# Patient Record
Sex: Male | Born: 1985 | Race: White | Hispanic: No | Marital: Single | Smoking: Current every day smoker
Health system: Southern US, Community
[De-identification: ages and names within clinical notes are randomized; demographics above are authoritative.]

## PROBLEM LIST (undated history)

## (undated) DIAGNOSIS — F1990 Other psychoactive substance use, unspecified, uncomplicated: Secondary | ICD-10-CM

## (undated) HISTORY — PX: APPENDECTOMY: SHX54

## (undated) HISTORY — DX: Other psychoactive substance use, unspecified, uncomplicated: F19.90

---
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
## 2012-01-08 ENCOUNTER — Ambulatory Visit (INDEPENDENT_AMBULATORY_CARE_PROVIDER_SITE_OTHER): Payer: BC Managed Care – PPO | Admitting: Family Medicine

## 2012-01-08 VITALS — BP 111/69 | HR 75 | Temp 99.0°F | Resp 16 | Ht 67.0 in | Wt 138.6 lb

## 2012-01-08 DIAGNOSIS — J029 Acute pharyngitis, unspecified: Secondary | ICD-10-CM

## 2012-01-08 DIAGNOSIS — J020 Streptococcal pharyngitis: Secondary | ICD-10-CM

## 2012-01-08 LAB — POCT RAPID STREP A (OFFICE): Rapid Strep A Screen: POSITIVE — AB

## 2012-01-08 MED ORDER — AMOXICILLIN 875 MG PO TABS: 875.0000 mg | ORAL_TABLET | Freq: Two times a day (BID) | ORAL | Status: DC

## 2012-01-08 NOTE — Patient Instructions (Addendum)
Strep Infections  Streptococcal (strep) infections are caused by streptococcal germs (bacteria). Strep infections are very contagious. Strep infections can occur in:   Ears.   The nose.   The throat.   Sinuses.   Skin.   Blood.   Lungs.   Spinal fluid.   Urine.  Strep throat is the most common bacterial infection in children. The symptoms of a Strep infection usually get better in 2 to 3 days after starting medicine that kills germs (antibiotics). Strep is usually not contagious after 36 to 48 hours of antibiotic treatment. Strep infections that are not treated can cause serious complications. These include gland infections, throat abscess, rheumatic fever and kidney disease.  DIAGNOSIS   The diagnosis of strep is made by:   A culture for the strep germ.  TREATMENT   These infections require oral antibiotics for a full 10 days, an antibiotic shot or antibiotics given into the vein (intravenous, IV).  HOME CARE INSTRUCTIONS    Be sure to finish all antibiotics even if feeling better.   Only take over-the-counter medicines for pain, discomfort and or fever, as directed by your caregiver.   Close contacts that have a fever, sore throat or illness symptoms should see their caregiver right away.   You or your child may return to work, school or daycare if the fever and pain are better in 2 to 3 days after starting antibiotics.  SEEK MEDICAL CARE IF:    You or your child has an oral temperature above 102 F (38.9 C).   Your baby is older than 3 months with a rectal temperature of 100.5 F (38.1 C) or higher for more than 1 day.   You or your child is not better in 3 days.  SEEK IMMEDIATE MEDICAL CARE IF:    You or your child has an oral temperature above 102 F (38.9 C), not controlled by medicine.   Your baby is older than 3 months with a rectal temperature of 102 F (38.9 C) or higher.   Your baby is 3 months old or younger with a rectal temperature of 100.4 F (38 C) or higher.   There is a  spreading rash.   There is difficulty swallowing or breathing.   There is increased pain or swelling.  Document Released: 03/01/2004 Document Revised: 04/16/2011 Document Reviewed: 12/08/2008  ExitCare Patient Information 2013 [employer].

## 2012-01-08 NOTE — Progress Notes (Signed)
26 yo man who developed sore throat on Sunday, followed by nausea and vomiting yesterday.  Now having chills, weakness, sore throat. Nausea has resolved.   No longer vomiting.  No diarrhea.  No abdominal pain.  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 Results for orders placed in visit on 01/08/12  POCT RAPID STREP A (OFFICE)      Component Value Range   Rapid Strep A Screen Positive (*) Negative    Assessment:  Strep throat  Plan:

## 2013-03-10 ENCOUNTER — Ambulatory Visit (INDEPENDENT_AMBULATORY_CARE_PROVIDER_SITE_OTHER): Payer: BC Managed Care – PPO | Admitting: Family Medicine

## 2013-03-10 VITALS — BP 110/70 | HR 62 | Temp 97.7°F | Resp 16 | Ht 67.0 in | Wt 136.0 lb

## 2013-03-10 DIAGNOSIS — B029 Zoster without complications: Secondary | ICD-10-CM

## 2013-03-10 DIAGNOSIS — G4700 Insomnia, unspecified: Secondary | ICD-10-CM

## 2013-03-10 MED ORDER — TRAZODONE HCL 100 MG PO TABS: 100.0000 mg | ORAL_TABLET | Freq: Every day | ORAL | Status: DC

## 2013-03-10 MED ORDER — VALACYCLOVIR HCL 1 G PO TABS: 1000.0000 mg | ORAL_TABLET | Freq: Three times a day (TID) | ORAL | Status: DC

## 2013-03-10 NOTE — Patient Instructions (Signed)
Insomnia Insomnia is frequent trouble falling and/or staying asleep. Insomnia can be a long term problem or a short term problem. Both are common. Insomnia can be a short term problem when the wakefulness is related to a certain stress or worry. Long term insomnia is often related to ongoing stress during waking hours and/or poor sleeping habits. Overtime, sleep deprivation itself can make the problem worse. Every little thing feels more severe because you are overtired and your ability to cope is decreased. CAUSES   Stress, anxiety, and depression.  Poor sleeping habits.  Distractions such as TV in the bedroom.  Naps close to bedtime.  Engaging in emotionally charged conversations before bed.  Technical reading before sleep.  Alcohol and other sedatives. They may make the problem worse. They can hurt normal sleep patterns and normal dream activity.  Stimulants such as caffeine for several hours prior to bedtime.  Pain syndromes and shortness of breath can cause insomnia.  Exercise late at night.  Changing time zones may cause sleeping problems (jet lag). It is sometimes helpful to have someone observe your sleeping patterns. They should look for periods of not breathing during the night (sleep apnea). They should also look to see how long those periods last. If you live alone or observers are uncertain, you can also be observed at a sleep clinic where your sleep patterns will be professionally monitored. Sleep apnea requires a checkup and treatment. Give your caregivers your medical history. Give your caregivers observations your family has made about your sleep.  SYMPTOMS   Not feeling rested in the morning.  Anxiety and restlessness at bedtime.  Difficulty falling and staying asleep. TREATMENT   Your caregiver may prescribe treatment for an underlying medical disorders. Your caregiver can give advice or help if you are using alcohol or other drugs for self-medication. Treatment  of underlying problems will usually eliminate insomnia problems.  Medications can be prescribed for short time use. They are generally not recommended for lengthy use.  Over-the-counter sleep medicines are not recommended for lengthy use. They can be habit forming.  You can promote easier sleeping by making lifestyle changes such as:  Using relaxation techniques that help with breathing and reduce muscle tension.  Exercising earlier in the day.  Changing your diet and the time of your last meal. No night time snacks.  Establish a regular time to go to bed.  Counseling can help with stressful problems and worry.  Soothing music and white noise may be helpful if there are background noises you cannot remove.  Stop tedious detailed work at least one hour before bedtime. HOME CARE INSTRUCTIONS   Keep a diary. Inform your caregiver about your progress. This includes any medication side effects. See your caregiver regularly. Take note of:  Times when you are asleep.  Times when you are awake during the night.  The quality of your sleep.  How you feel the next day. This information will help your caregiver care for you.  Get out of bed if you are still awake after 15 minutes. Read or do some quiet activity. Keep the lights down. Wait until you feel sleepy and go back to bed.  Keep regular sleeping and waking hours. Avoid naps.  Exercise regularly.  Avoid distractions at bedtime. Distractions include watching television or engaging in any intense or detailed activity like attempting to balance the household checkbook.  Develop a bedtime ritual. Keep a familiar routine of bathing, brushing your teeth, climbing into bed at the same   time each night, listening to soothing music. Routines increase the success of falling to sleep faster.  Use relaxation techniques. This can be using breathing and muscle tension release routines. It can also include visualizing peaceful scenes. You can  also help control troubling or intruding thoughts by keeping your mind occupied with boring or repetitive thoughts like the old concept of counting sheep. You can make it more creative like imagining planting one beautiful flower after another in your backyard garden.  During your day, work to eliminate stress. When this is not possible use some of the previous suggestions to help reduce the anxiety that accompanies stressful situations. MAKE SURE YOU:   Understand these instructions.  Will watch your condition.  Will get help right away if you are not doing well or get worse. Document Released: 01/20/2000 Document Revised: 04/16/2011 Document Reviewed: 02/19/2007 [facility] Patient Information 2014 [place], [place]. Shingles Shingles (herpes zoster) is an infection that is caused by the same virus that causes chickenpox (varicella). The infection causes a painful skin rash and fluid-filled blisters, which eventually break open, crust over, and heal. It may occur in any area of the body, but it usually affects only one side of the body or face. The pain of shingles usually lasts about 1 month. However, some people with shingles may develop long-term (chronic) pain in the affected area of the body. Shingles often occurs many years after the person had chickenpox. It is more common:  In people older than 50 years.  In people with weakened immune systems, such as those with HIV, AIDS, or cancer.  In people taking medicines that weaken the immune system, such as transplant medicines.  In people under great stress. CAUSES  Shingles is caused by the varicella zoster virus (VZV), which also causes chickenpox. After a person is infected with the virus, it can remain in the person's body for years in an inactive state (dormant). To cause shingles, the virus reactivates and breaks out as an infection in a nerve root. The virus can be spread from person to person (contagious) through contact with open  blisters of the shingles rash. It will only spread to people who have not had chickenpox. When these people are exposed to the virus, they may develop chickenpox. They will not develop shingles. Once the blisters scab over, the person is no longer contagious and cannot spread the virus to others. SYMPTOMS  Shingles shows up in stages. The initial symptoms may be pain, itching, and tingling in an area of the skin. This pain is usually described as burning, stabbing, or throbbing.In a few days or weeks, a painful red rash will appear in the area where the pain, itching, and tingling were felt. The rash is usually on one side of the body in a band or belt-like pattern. Then, the rash usually turns into fluid-filled blisters. They will scab over and dry up in approximately 2 3 weeks. Flu-like symptoms may also occur with the initial symptoms, the rash, or the blisters. These may include:  Fever.  Chills.  Headache.  Upset stomach. DIAGNOSIS  Your caregiver will perform a skin exam to diagnose shingles. Skin scrapings or fluid samples may also be taken from the blisters. This sample will be examined under a microscope or sent to a lab for further testing. TREATMENT  There is no specific cure for shingles. Your caregiver will likely prescribe medicines to help you manage the pain, recover faster, and avoid long-term problems. This may include antiviral drugs, anti-inflammatory  drugs, and pain medicines. HOME CARE INSTRUCTIONS   Take a cool bath or apply cool compresses to the area of the rash or blisters as directed. This may help with the pain and itching.   Only take over-the-counter or prescription medicines as directed by your caregiver.   Rest as directed by your caregiver.  Keep your rash and blisters clean with mild soap and cool water or as directed by your caregiver.  Do not pick your blisters or scratch your rash. Apply an anti-itch cream or numbing creams to the affected area as  directed by your caregiver.  Keep your shingles rash covered with a loose bandage (dressing).  Avoid skin contact with:  Babies.   Pregnant women.   Children with eczema.   Elderly people with transplants.   People with chronic illnesses, such as leukemia or AIDS.   Wear loose-fitting clothing to help ease the pain of material rubbing against the rash.  Keep all follow-up appointments with your caregiver.If the area involved is on your face, you may receive a referral for follow-up to a specialist, such as an eye doctor (ophthalmologist) or an ear, nose, and throat (ENT) doctor. Keeping all follow-up appointments will help you avoid eye complications, chronic pain, or disability.  SEEK IMMEDIATE MEDICAL CARE IF:   You have facial pain, pain around the eye area, or loss of feeling on one side of your face.  You have ear pain or ringing in your ear.  You have loss of taste.  Your pain is not relieved with prescribed medicines.   Your redness or swelling spreads.   You have more pain and swelling.  Your condition is worsening or has changed.   You have a feveror persistent symptoms for more than 2 3 days.  You have a fever and your symptoms suddenly get worse. MAKE SURE YOU:  Understand these instructions.  Will watch your condition.  Will get help right away if you are not doing well or get worse. Document Released: 01/22/2005 Document Revised: 10/17/2011 Document Reviewed: 09/06/2011 [facility] Patient Information 2014 [place], [place].

## 2013-03-10 NOTE — Progress Notes (Signed)
° °  Subjective:  This chart was scribed for Elvina SidleKurt Lauenstein, MD by Carl Bestelina Holson, Medical Scribe. This patient was seen in Room 2 and the patient's care was started at 4:29 PM.   Patient ID: Jermaine Hood, male    DOB: 06-25-1985, 28 y.o.   MRN: [id number]  HPI HPI Comments: Jermaine Hood is a 28 y.o. male who presents to the [facility] complaining of a constant burning, and tingling rash located on the left side of his scalp that started three days ago.  He states that the burning sensation is present on the left side of his face.  He lists sleep disturbances, headaches, and fatigue as associated symptoms.  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    History reviewed. No pertinent past medical history. Past Surgical History  Procedure Laterality Date   Appendectomy     History reviewed. No pertinent family history. History   Social History   Marital Status: Single    Spouse Name: N/A    Number of Children: N/A   Years of Education: N/A   Occupational History   Not on file.   Social History Main Topics   Smoking status: Current Every Day Smoker   Smokeless tobacco: Not on file   Alcohol Use: Not on file   Drug Use: Not on file   Sexual Activity: Not on file   Other Topics Concern   Not on file   Social History Narrative   No narrative on file   No Known Allergies   Review of Systems  Constitutional: Positive for fatigue.  Skin: Positive for rash (left sided scalp).  Neurological: Positive for headaches.  Psychiatric/Behavioral: Positive for sleep disturbance.  All other systems reviewed and are negative.    Objective:  Physical Exam  Nursing note and vitals reviewed. Constitutional: He is oriented to person, place, and time. He appears well-developed and well-nourished. No distress.  HENT:  Head: Normocephalic and atraumatic.  Eyes: EOM are normal.  Cardiovascular: Normal rate.   Musculoskeletal: Normal range of  motion.  Neurological: He is alert and oriented to person, place, and time.  Skin: Skin is warm and dry. Rash noted.  Psychiatric: He has a normal mood and affect. His behavior is normal.   Vesicular rash over left scalp and forehead extending to the area behind his left ear    BP 110/70   Pulse 62   Temp(Src) 97.7 F (36.5 C) (Oral)   Resp 16   Ht 5\' 7"  (1.702 m)   Wt 136 lb (61.689 kg)   BMI 21.30 kg/m2   SpO2 99% Assessment & Plan:   Insomnia - Plan: traZODone (DESYREL) 100 MG tablet  Shingles - Plan: valACYclovir (VALTREX) 1000 MG tablet  Signed, Elvina SidleKurt Lauenstein, MD    I personally performed the services described in this documentation, which was scribed in my presence. The recorded information has been reviewed and is accurate.

## 2013-03-12 ENCOUNTER — Telehealth: Payer: Self-pay | Admitting: Family Medicine

## 2013-03-12 NOTE — Telephone Encounter (Signed)
Per Dr. Cleta Albertsaub appt scheduled with Dr. Dione BoozeGroat today at 11am. Patient notified and voiced understanding.

## 2013-03-26 ENCOUNTER — Ambulatory Visit (INDEPENDENT_AMBULATORY_CARE_PROVIDER_SITE_OTHER): Payer: BC Managed Care – PPO | Admitting: Emergency Medicine

## 2013-03-26 ENCOUNTER — Other Ambulatory Visit: Payer: Self-pay | Admitting: Emergency Medicine

## 2013-03-26 VITALS — BP 110/80 | HR 70 | Temp 98.4°F | Resp 18 | Ht 67.5 in | Wt 132.2 lb

## 2013-03-26 DIAGNOSIS — Z1159 Encounter for screening for other viral diseases: Secondary | ICD-10-CM

## 2013-03-26 DIAGNOSIS — B029 Zoster without complications: Secondary | ICD-10-CM

## 2013-03-26 DIAGNOSIS — B0229 Other postherpetic nervous system involvement: Secondary | ICD-10-CM

## 2013-03-26 DIAGNOSIS — Z114 Encounter for screening for human immunodeficiency virus [HIV]: Secondary | ICD-10-CM

## 2013-03-26 DIAGNOSIS — R748 Abnormal levels of other serum enzymes: Secondary | ICD-10-CM

## 2013-03-26 DIAGNOSIS — F1921 Other psychoactive substance dependence, in remission: Secondary | ICD-10-CM | POA: Insufficient documentation

## 2013-03-26 LAB — COMPREHENSIVE METABOLIC PANEL
ALT: 463 U/L — ABNORMAL HIGH (ref 0–53)
AST: 211 U/L — ABNORMAL HIGH (ref 0–37)
Albumin: 4.7 g/dL (ref 3.5–5.2)
Alkaline Phosphatase: 77 U/L (ref 39–117)
BUN: 11 mg/dL (ref 6–23)
CO2: 31 mEq/L (ref 19–32)
Calcium: 10.2 mg/dL (ref 8.4–10.5)
Chloride: 100 mEq/L (ref 96–112)
Creat: 0.87 mg/dL (ref 0.50–1.35)
Glucose, Bld: 96 mg/dL (ref 70–99)
Potassium: 4.5 mEq/L (ref 3.5–5.3)
Sodium: 139 mEq/L (ref 135–145)
Total Bilirubin: 1.1 mg/dL (ref 0.2–1.2)
Total Protein: 7.9 g/dL (ref 6.0–8.3)

## 2013-03-26 LAB — POCT CBC
Granulocyte percent: 54.9 %G (ref 37–80)
HCT, POC: 46.3 % (ref 43.5–53.7)
Hemoglobin: 15.1 g/dL (ref 14.1–18.1)
Lymph, poc: 2.8 (ref 0.6–3.4)
MCH, POC: 30.4 pg (ref 27–31.2)
MCHC: 32.6 g/dL (ref 31.8–35.4)
MCV: 93.1 fL (ref 80–97)
MID (cbc): 0.6 (ref 0–0.9)
MPV: 9 fL (ref 0–99.8)
POC Granulocyte: 4.1 (ref 2–6.9)
POC LYMPH PERCENT: 37 %L (ref 10–50)
POC MID %: 8.1 %M (ref 0–12)
Platelet Count, POC: 309 10*3/uL (ref 142–424)
RBC: 4.97 M/uL (ref 4.69–6.13)
RDW, POC: 14.2 %
WBC: 7.5 10*3/uL (ref 4.6–10.2)

## 2013-03-26 LAB — HEPATITIS PANEL, ACUTE
HCV Ab: REACTIVE — AB
Hep A IgM: NONREACTIVE
Hep B C IgM: NONREACTIVE
Hepatitis B Surface Ag: NEGATIVE

## 2013-03-26 LAB — HIV ANTIBODY (ROUTINE TESTING W REFLEX): HIV: NONREACTIVE

## 2013-03-26 MED ORDER — GABAPENTIN 300 MG PO CAPS: ORAL_CAPSULE | ORAL | Status: DC

## 2013-03-26 NOTE — Patient Instructions (Signed)
Postherpetic Neuralgia Shingles is a painful disease. It is caused by the herpes zoster virus. This is the same virus which also causes chickenpox. It can affect the torso, limbs, or the face. For most people, shingles is a condition of rather sudden onset. Pain usually lasts about 1 month. In older patients, or patients with poor immune systems, a painful, long-standing (chronic) condition called postherpetic neuralgia can develop. This condition rarely happens before age 50. But at least 50% of people over 50 become affected following an attack of shingles. There is a natural tendency for this condition to improve over time with no treatment. Less than 5% of patients have pain that lasts for more than 1 year. DIAGNOSIS  Herpes is usually easily diagnosed on physical exam. Pain sometimes follows when the skin sores (lesions) have disappeared. It is called postherpetic neuralgia. That name simply means the pain that follows herpes. TREATMENT   Treating this condition may be difficult. Usually one of the tricyclic antidepressants, often amitriptyline, is the first line of treatment. There is evidence that the sooner these medications are given, the more likely they are to reduce pain.  Conventional analgesics, regional nerve blocks, and anticonvulsants have little benefit in most cases when used alone. Other tricyclic anti-depressants are used as a second option if the first antidepressant is unsuccessful.  Anticonvulsants, including carbamazepine, have been found to provide some added benefit when used with a tricyclic anti-depressant. This is especially for the stabbing type of pain similar to that of trigeminal neuralgia.  Chronic opioid therapy. This is a strong narcotic pain medication. It is used to treat pain that is resistant to other measures. The issues of dependency and tolerance can be reduced with closely managed care.  Some cream treatments are applied locally to the affected area. They  can help when used with other treatments. Their use may be difficult in the case of postherpetic trigeminal neuralgia. This is involved with the face. So the substances can irritate the eye and the skin around the eye. Examples of creams used include Capsaicin and lidocaine creams.  For shingles, antiviral therapies along with analgesics are recommended. Studies of the effect of anti-viral agents such as acyclovir on shingles have been done. They show improved rates of healing and decreased severity of sudden (acute) pain. Some observations suggest that nerve blocks during shingles infection will:  Reduce pain.  Shorten the acute episode.  Prevent the emergence of postherpetic neuralgia. Viral medications used include Acyclovir (Zovirax), Valacyclovir, Famciclovir and a lysine diet. Document Released: 04/14/2002 Document Revised: 04/16/2011 Document Reviewed: 01/22/2005 [employer] Patient Information 2014 [employer].  

## 2013-03-26 NOTE — Progress Notes (Signed)
   Subjective:    Patient ID: Jermaine Hood, male    DOB: [birth year], 28 y.o.   MRN: [id number] This chart was scribed for Jermaine ChickKristi M Smith, MD by Danella Maiersaroline Early, ED Scribe. This patient was seen in room 5 and the patient's care was started at 8:25 AM.  Chief Complaint  Patient presents with  . Herpes Zoster    Follow up    HPI HPI Comments: Jermaine HippGrayson S Eltzroth is a 28 y.o. male who presents to the [facility] for a 2-week follow up on shingles. He states the rash is somewhat improved but he is still having pain, headaches, and fatigue. He states he has been taking Trazodone with no relief. He reports flu-like symptoms last week that resolved.  He also reports feeling stressed about his friend Sam who was living with him for a while and needs to go to rehab. He has never been on any medications for stress.  PCP - No primary provider on file.  History reviewed. No pertinent past medical history.  Current Outpatient Prescriptions on File Prior to Visit  Medication Sig Dispense Refill  . buprenorphine-naloxone (SUBOXONE) 8-2 MG SUBL Place under the tongue.      . traZODone (DESYREL) 100 MG tablet Take 1 tablet (100 mg total) by mouth at bedtime.  30 tablet  10  . valACYclovir (VALTREX) 1000 MG tablet Take 1 tablet (1,000 mg total) by mouth 3 (three) times daily.  21 tablet  0   No current facility-administered medications on file prior to visit.   No Known Allergies    Review of Systems  Constitutional: Positive for fatigue.  Neurological: Positive for headaches.       Objective:   Physical Exam CONSTITUTIONAL: Well developed/well nourished HEAD: Normocephalic/atraumatic there is a resolving shingles eruption above the left thigh extending into the left temple area. EYES: EOMI/PERRL ENMT: Mucous membranes moist NECK: supple no meningeal signs SPINE:entire spine nontender CV: S1/S2 noted, no murmurs/rubs/gallops noted LUNGS: Lungs are clear to auscultation  bilaterally, no apparent distress ABDOMEN: soft, nontender, no rebound or guarding GU:no cva tenderness NEURO: Pt is awake/alert, moves all extremitiesx4 EXTREMITIES: pulses normal, full ROM SKIN: warm, color normal PSYCH: no abnormalities of mood noted   Filed Vitals:   03/26/13 0809  BP: 110/80  Pulse: 70  Temp: 98.4 F (36.9 C)  TempSrc: Oral  Resp: 18  Height: 5' 7.5" (1.715 m)  Weight: 132 lb 3.2 oz (59.966 kg)  SpO2: 97%        Assessment & Plan:  Patient looks good. We'll try Neurontin starting on 300 mg in the morning and 300 mg at bedtime. If he does well we can increase the this to 300 mg in the morning and 600 at bedtime. He will let me know how he does on this medication regimen.  **Disclaimer: This note was dictated with voice recognition software. Similar sounding words can inadvertently be transcribed and this note may contain transcription errors which may not have been corrected upon publication of note.**  I personally performed the services described in this documentation, which was scribed in my presence. The recorded information has been reviewed and is accurate.

## 2013-04-01 ENCOUNTER — Ambulatory Visit (INDEPENDENT_AMBULATORY_CARE_PROVIDER_SITE_OTHER): Payer: BC Managed Care – PPO | Admitting: Emergency Medicine

## 2013-04-01 ENCOUNTER — Other Ambulatory Visit: Payer: Self-pay | Admitting: Emergency Medicine

## 2013-04-01 VITALS — BP 102/72 | HR 71 | Temp 98.1°F | Resp 16 | Ht 67.0 in | Wt 132.0 lb

## 2013-04-01 DIAGNOSIS — B1920 Unspecified viral hepatitis C without hepatic coma: Secondary | ICD-10-CM

## 2013-04-01 DIAGNOSIS — Z23 Encounter for immunization: Secondary | ICD-10-CM

## 2013-04-01 DIAGNOSIS — R945 Abnormal results of liver function studies: Secondary | ICD-10-CM

## 2013-04-01 DIAGNOSIS — R7989 Other specified abnormal findings of blood chemistry: Secondary | ICD-10-CM

## 2013-04-01 DIAGNOSIS — Z0271 Encounter for disability determination: Secondary | ICD-10-CM

## 2013-04-01 LAB — POCT CBC
Granulocyte percent: 57.1 %G (ref 37–80)
HCT, POC: 51.3 % (ref 43.5–53.7)
HEMOGLOBIN: 16.5 g/dL (ref 14.1–18.1)
LYMPH, POC: 2.1 (ref 0.6–3.4)
MCH: 30.2 pg (ref 27–31.2)
MCHC: 32.2 g/dL (ref 31.8–35.4)
MCV: 94 fL (ref 80–97)
MID (cbc): 0.4 (ref 0–0.9)
MPV: 9.9 fL (ref 0–99.8)
POC Granulocyte: 3.4 (ref 2–6.9)
POC LYMPH PERCENT: 36.3 %L (ref 10–50)
POC MID %: 6.6 % (ref 0–12)
Platelet Count, POC: 260 10*3/uL (ref 142–424)
RBC: 5.46 M/uL (ref 4.69–6.13)
RDW, POC: 13.7 %
WBC: 5.9 10*3/uL (ref 4.6–10.2)

## 2013-04-01 LAB — HEPATIC FUNCTION PANEL
ALK PHOS: 83 U/L (ref 39–117)
ALT: 361 U/L — AB (ref 0–53)
AST: 101 U/L — AB (ref 0–37)
Albumin: 4.7 g/dL (ref 3.5–5.2)
Bilirubin, Direct: 0.2 mg/dL (ref 0.0–0.3)
Indirect Bilirubin: 0.6 mg/dL (ref 0.2–1.2)
TOTAL PROTEIN: 8.1 g/dL (ref 6.0–8.3)
Total Bilirubin: 0.8 mg/dL (ref 0.2–1.2)

## 2013-04-01 NOTE — Progress Notes (Signed)
Subjective:    Patient ID: Jermaine Hood, male    DOB: 1985-05-20, 28 y.o.   MRN: [id number] This chart was scribed for Collene GobbleSteven A Zona Pedro, MD by Valera CastleSteven Perry, ED Scribe. This patient was seen in room 12 and the patient's care was started at 9:57 AM.  Chief Complaint  Patient presents with  . Follow-up    SHINGLES AND LABS   HPI Jermaine Hood is a 28 y.o. male Pt presents for Shingles follow up and lab results. Pt has h/o substance dependence, is in remission. Labs resulted in positive testing for Hep-C.   Results for orders placed in visit on 03/26/13  HEPATITIS PANEL, ACUTE      Result Value Ref Range   Hepatitis B Surface Ag NEGATIVE  NEGATIVE   HCV Ab REACTIVE (*) NEGATIVE   Hep B C IgM NON REACTIVE  NON REACTIVE   Hep A IgM NON REACTIVE  NON REACTIVE   He states he has been taking Ceboxone for 1.5-2 years, and has been working well for him. He denies feeling like there was anything else going on. He reports h/o drug injection for a period of 6 months before going clean. Last injection was 2 years ago. He states he tried to be sterile with injections, used his own syringes.   PCP - No primary provider on file.  Patient Active Problem List   Diagnosis Date Noted  . Substance dependence, in remission 03/26/2013   No past medical history on file. Past Surgical History  Procedure Laterality Date  . Appendectomy     No Known Allergies Prior to Admission medications   Medication Sig Start Date End Date Taking? Authorizing Provider  buprenorphine-naloxone (SUBOXONE) 8-2 MG SUBL Place under the tongue.   Yes Historical Provider, MD  gabapentin (NEURONTIN) 300 MG capsule Take one capsule in the morning and one capsule at bedtime for the first 3-4 days and if tolerated increase to one capsule in the morning and 2 capsules at bedtime 03/26/13  Yes [name] Weingarten, MD  valACYclovir (VALTREX) 1000 MG tablet Take 1 tablet (1,000 mg total) by mouth 3 (three) times daily. 03/10/13   [name], MD   Review of Systems  Constitutional: Negative for fever.  Gastrointestinal: Negative for abdominal pain.      Objective:   Physical Exam CONSTITUTIONAL: Well developed/well nourished HEAD: Normocephalic/atraumatic EYES: EOMI/PERRL ENMT: Mucous membranes moist NECK: supple no meningeal signs SPINE:entire spine nontender CV: Normal rate LUNGS: No apparent distress ABDOMEN:  GU: NEURO: Pt is awake/alert, moves all extremitiesx4 EXTREMITIES: pulses normal, full ROM SKIN: warm, color normal PSYCH: no abnormalities of mood noted  BP 102/72  Pulse 71  Temp(Src) 98.1 F (36.7 C) (Oral)  Resp 16  Ht 5\' 7"  (1.702 m)  Wt 132 lb (59.875 kg)  BMI 20.67 kg/m2  SpO2 100% Results for orders placed in visit on 04/01/13  POCT CBC      Result Value Ref Range   WBC 5.9  4.6 - 10.2 K/uL   Lymph, poc 2.1  0.6 - 3.4   POC LYMPH PERCENT 36.3  10 - 50 %L   MID (cbc) 0.4  0 - 0.9   POC MID % 6.6  0 - 12 %M   POC Granulocyte 3.4  2 - 6.9   Granulocyte percent 57.1  37 - 80 %G   RBC 5.46  4.69 - 6.13 M/uL   Hemoglobin 16.5  14.1 - 18.1 g/dL   HCT, POC 51.3  43.5 - 53.7 %   MCV 94.0  80 - 97 fL   MCH, POC 30.2  27 - 31.2 pg   MCHC 32.2  31.8 - 35.4 g/dL   RDW, POC 14.7     Platelet Count, POC 260  142 - 424 K/uL   MPV 9.9  0 - 99.8 fL      Assessment & Plan:   Patient is distraught over his diagnosis of hep C. We'll do confirmatory test today. He was given hep A vaccine. Repeat LFT tests were done. Referral made to the hep C clinic. He does state his Neurontin is helping his facial pain.  **Disclaimer: This note was dictated with voice recognition software. Similar sounding words can inadvertently be transcribed and this note may contain transcription errors which may not have been corrected upon publication of note.**   I personally performed the services described in this documentation, which was scribed in my presence. The recorded information has been reviewed and  is accurate.

## 2013-04-02 LAB — HEPATITIS B SURFACE ANTIBODY, QUANTITATIVE: Hepatitis B-Post: 1000 m[IU]/mL

## 2013-04-03 LAB — HEPATITIS C VRS RNA DETECT BY PCR-QUAL: Hepatitis C Vrs RNA by PCR-Qual: POSITIVE — AB

## 2014-03-02 ENCOUNTER — Other Ambulatory Visit: Payer: Self-pay | Admitting: Emergency Medicine

## 2014-03-02 ENCOUNTER — Encounter: Payer: Self-pay | Admitting: Emergency Medicine

## 2014-03-02 ENCOUNTER — Ambulatory Visit (INDEPENDENT_AMBULATORY_CARE_PROVIDER_SITE_OTHER): Payer: BLUE CROSS/BLUE SHIELD | Admitting: Emergency Medicine

## 2014-03-02 VITALS — BP 120/80 | HR 70 | Temp 98.4°F | Resp 16 | Ht 67.5 in | Wt 130.0 lb

## 2014-03-02 DIAGNOSIS — Z23 Encounter for immunization: Secondary | ICD-10-CM

## 2014-03-02 DIAGNOSIS — B1920 Unspecified viral hepatitis C without hepatic coma: Secondary | ICD-10-CM

## 2014-03-02 DIAGNOSIS — B182 Chronic viral hepatitis C: Secondary | ICD-10-CM | POA: Insufficient documentation

## 2014-03-02 LAB — COMPLETE METABOLIC PANEL WITH GFR
ALBUMIN: 4.3 g/dL (ref 3.5–5.2)
ALT: 23 U/L (ref 0–53)
AST: 19 U/L (ref 0–37)
Alkaline Phosphatase: 57 U/L (ref 39–117)
BUN: 14 mg/dL (ref 6–23)
CO2: 27 mEq/L (ref 19–32)
Calcium: 9.8 mg/dL (ref 8.4–10.5)
Chloride: 103 mEq/L (ref 96–112)
Creat: 0.9 mg/dL (ref 0.50–1.35)
GFR, Est Non African American: >89 mL/min
Glucose, Bld: 116 mg/dL — ABNORMAL HIGH (ref 70–99)
POTASSIUM: 4.4 meq/L (ref 3.5–5.3)
SODIUM: 137 meq/L (ref 135–145)
Total Bilirubin: 0.6 mg/dL (ref 0.2–1.2)
Total Protein: 7.3 g/dL (ref 6.0–8.3)

## 2014-03-02 LAB — CBC WITH DIFFERENTIAL/PLATELET
Basophils Absolute: 0 10*3/uL (ref 0.0–0.1)
Basophils Relative: 0 % (ref 0–1)
Eosinophils Absolute: 0.2 10*3/uL (ref 0.0–0.7)
Eosinophils Relative: 2 % (ref 0–5)
HCT: 43.9 % (ref 39.0–52.0)
Hemoglobin: 15.3 g/dL (ref 13.0–17.0)
Lymphocytes Relative: 31 % (ref 12–46)
Lymphs Abs: 2.3 10*3/uL (ref 0.7–4.0)
MCH: 30.4 pg (ref 26.0–34.0)
MCHC: 34.9 g/dL (ref 30.0–36.0)
MCV: 87.1 fL (ref 78.0–100.0)
MPV: 10.8 fL (ref 8.6–12.4)
Monocytes Absolute: 0.4 10*3/uL (ref 0.1–1.0)
Monocytes Relative: 5 % (ref 3–12)
Neutro Abs: 4.7 10*3/uL (ref 1.7–7.7)
Neutrophils Relative %: 62 % (ref 43–77)
Platelets: 288 10*3/uL (ref 150–400)
RBC: 5.04 MIL/uL (ref 4.22–5.81)
RDW: 13.1 % (ref 11.5–15.5)
WBC: 7.5 10*3/uL (ref 4.0–10.5)

## 2014-03-02 LAB — HIV ANTIBODY (ROUTINE TESTING W REFLEX): HIV 1&2 Ab, 4th Generation: NONREACTIVE

## 2014-03-02 LAB — HEPATITIS C ANTIBODY: HCV Ab: REACTIVE — AB

## 2014-03-02 NOTE — Progress Notes (Signed)
   Subjective:  This chart was scribed for Jermaine GobbleSteven A Yaneliz Radebaugh, MD by Jermaine Hood, ED Scribe. The patient was seen in room 21. Patient's care was started at 9:13 AM.   Patient ID: Jermaine Hood, male    DOB: [birth year], 29 y.o.   MRN: [id number]  HPI  HPI Comments: Jermaine HippGrayson S Burroughs is a 29 y.o. male with a history of Hepatitis C who presents to the [facility] for a f/u regarding medications. He states that he is no longer taking Neurontin, which he took for shingles pain. He has been taking Suboxone prescribed by Dr[facility] Manson Hood at [facility], but he was recently discharged and he only has 2 strips left. He states that Dr. Manson Hood told him that his lab results showed no Suboxone in his urine despite the fact that he takes it daily, and the doctor accused him of selling his prescription. He states that he "had words" with a councilor at [facility], and he expresses concern that this was the root cause of the incident.  He has been taking Suboxone for 3 years, and he expresses concern about withdrawal. He states that he will come in for help if his withdrawal is bad. He states that he is due for a second booster of Hepatitis A today. He is agreeable to going to the infectious disease clinic for his hepatitis C. He states that he has not used IV drugs for 3 years. He states that he occasionally drinks 1-2 beers on the weekends. He states that he will think about finding a new councilor to talk about anxiety, but he did not like group therapy at [facility]. He states that he works hard and feels relatively good mentally at this time. He is not sure whether is Tetanus is UTD.  Review of Systems  Constitutional: Negative for fever and chills.  HENT: Negative for congestion, rhinorrhea and sore throat.   Respiratory: Negative for cough and shortness of breath.   Cardiovascular: Negative for chest pain.  Gastrointestinal: Negative for nausea, vomiting, abdominal pain  and diarrhea.  Genitourinary: Negative for dysuria.   Objective:  Physical Exam  CONSTITUTIONAL: Well developed/well nourished HEAD: Normocephalic/atraumatic EYES: EOMI/PERRL ENMT: Mucous membranes moist NECK: supple no meningeal signs SPINE/BACK:entire spine nontender CV: S1/S2 noted, no murmurs/rubs/gallops noted LUNGS: Lungs are clear to auscultation bilaterally, no apparent distress ABDOMEN: soft, nontender, no rebound or guarding, bowel sounds noted throughout abdomen GU:no cva tenderness NEURO: Pt is awake/alert/appropriate, moves all extremitiesx4.  No facial droop.   EXTREMITIES: pulses normal/equal, full ROM SKIN: warm, color normal  PSYCH: no abnormalities of mood noted, alert and oriented to situation  Assessment & Plan:   Currently interested in changing to a different psychiatric group. He currently does not go to meetings. He states he has not done any IV drugs for 3 years. He states he had a conflict with the counselor he was seeing at [facility] and was dismissed from their practice because no Suboxone shown on his drug screen. HEP A vaccine given today. Referral made to Infectious Disease for treatment of his HEP C. I discussed the situation with his father, and advised that if he had withdrawal symptoms after stopping the Suboxone he needed to go to [facility] for evaluation. I personally performed the services described in this documentation, which was scribed in my presence. The recorded information has been reviewed and is accurate.

## 2014-03-02 NOTE — Progress Notes (Deleted)
° °  Subjective:  This chart was scribed for Collene GobbleSteven A Daub, MD by [name], ED Scribe. The patient was seen in room 21. Patient's care was started at 9:13 AM.   Patient ID: Jermaine Hood, male    DOB: 1985-09-22, 29 y.o.   MRN: [id number]  HPI  HPI Comments: Jermaine Hood is a 29 y.o. male with a history of Hepatitis C who presents to the [facility] for a f/u regarding medications. He states that he is no longer taking Neurontin, which he took for shingles pain. He has been taking Suboxone prescribed by Dr. Manson PasseyBrown at [facility], but he was recently discharged and he only has 2 strips left. He states that Dr. Manson PasseyBrown told him that his lab results showed no Suboxone in his urine despite the fact that he takes it daily, and the doctor accused him of selling his prescription. He states that he "had words" with a councilor at [facility], and he expresses concern that this was the root cause of the incident.  He has been taking Suboxone for 3 years, and he expresses concern about withdrawal. He states that he will come in for help if his withdrawal is bad. He states that he is due for a second booster of Hepatitis A today. He is agreeable to going to the infectious disease clinic for his hepatitis C. He states that he has not used IV drugs for 3 years. He states that he occasionally drinks 1-2 beers on the weekends. He states that he will think about finding a new councilor to talk about anxiety, but he did not like group therapy at [facility]. He states that he works hard and feels relatively good mentally at this time. He is not sure whether is Tetanus is UTD.  Review of Systems  Constitutional: Negative for fever and chills.  HENT: Negative for congestion, rhinorrhea and sore throat.   Respiratory: Negative for cough and shortness of breath.   Cardiovascular: Negative for chest pain.  Gastrointestinal: Negative for nausea, vomiting, abdominal pain  and diarrhea.  Genitourinary: Negative for dysuria.   Objective:  Physical Exam  CONSTITUTIONAL: Well developed/well nourished HEAD: Normocephalic/atraumatic EYES: EOMI/PERRL ENMT: Mucous membranes moist NECK: supple no meningeal signs SPINE/BACK:entire spine nontender CV: S1/S2 noted, no murmurs/rubs/gallops noted LUNGS: Lungs are clear to auscultation bilaterally, no apparent distress ABDOMEN: soft, nontender, no rebound or guarding, bowel sounds noted throughout abdomen GU:no cva tenderness NEURO: Pt is awake/alert/appropriate, moves all extremitiesx4.  No facial droop.   EXTREMITIES: pulses normal/equal, full ROM SKIN: warm, color normal  PSYCH: no abnormalities of mood noted, alert and oriented to situation  Assessment & Plan:   Currently interested in changing to a different psychiatric group. He currently does not go to meetings. He states he has not done any IV drugs for 3 years. He states he had a conflict with the counselor he was seeing at [facility] and was dismissed from their practice because no Suboxone shown on his drug screen. HEP A vaccine given today. Referral made to Infectious Disease for treatment of his HEP C. I discussed the situation with his father, and advised that if he had withdrawal symptoms after stopping the Suboxone he needed to [facility] to [facility] for evaluation.

## 2014-03-03 LAB — HEPATITIS C RNA QUANTITATIVE
HCV QUANT LOG: 3.24 {Log} — AB (ref <1.18)
HCV Quantitative: 1745 IU/mL — ABNORMAL HIGH (ref <15)

## 2014-03-09 ENCOUNTER — Other Ambulatory Visit: Payer: BLUE CROSS/BLUE SHIELD

## 2014-03-09 DIAGNOSIS — B182 Chronic viral hepatitis C: Secondary | ICD-10-CM

## 2014-03-09 DIAGNOSIS — B180 Chronic viral hepatitis B with delta-agent: Secondary | ICD-10-CM

## 2014-03-09 LAB — HEPATITIS B SURFACE ANTIBODY,QUALITATIVE: Hep B S Ab: POSITIVE — AB

## 2014-03-09 LAB — PROTIME-INR
INR: 1 (ref <1.50)
Prothrombin Time: 13.2 seconds (ref 11.6–15.2)

## 2014-03-09 LAB — IRON: IRON: 86 ug/dL (ref 42–165)

## 2014-03-09 LAB — HEPATITIS B CORE ANTIBODY, TOTAL: Hep B Core Total Ab: NONREACTIVE

## 2014-03-09 LAB — HEPATITIS B SURFACE ANTIGEN: HEP B S AG: NEGATIVE

## 2014-03-09 LAB — HEPATITIS A ANTIBODY, TOTAL: Hep A Total Ab: REACTIVE — AB

## 2014-03-11 LAB — ANTI-NUCLEAR AB-TITER (ANA TITER): ANA Titer 1: NEGATIVE

## 2014-03-11 LAB — ANA: ANA: POSITIVE — AB

## 2014-03-12 LAB — HEPATITIS C GENOTYPE: HCV GENOTYPE: 1

## 2014-03-31 ENCOUNTER — Encounter: Payer: Self-pay | Admitting: Internal Medicine

## 2014-03-31 ENCOUNTER — Ambulatory Visit (INDEPENDENT_AMBULATORY_CARE_PROVIDER_SITE_OTHER): Payer: BLUE CROSS/BLUE SHIELD | Admitting: Internal Medicine

## 2014-03-31 VITALS — BP 131/80 | HR 79 | Temp 98.3°F | Ht 67.0 in | Wt 130.0 lb

## 2014-03-31 DIAGNOSIS — B182 Chronic viral hepatitis C: Secondary | ICD-10-CM | POA: Diagnosis not present

## 2014-03-31 NOTE — Progress Notes (Signed)
+  Jermaine Hood is a 29 y.o. male who presents for initial evaluation and management of a positive Hepatitis C antibody test.  Patient tested positive last year during evaluation for transaminitis. Hepatitis C risk factors present are: IV drug abuse (details: last used over 3 years ago). Patient denies intranasal drug use, multiple sexual partners, renal dialysis, sexual contact with person with liver disease. Patient has had other studies performed. Results: hepatitis C RNA by PCR, result: positive. Patient has not had prior treatment for Hepatitis C. Patient does not have a past history of liver disease. Patient does not have a family history of liver disease.   HPI: He has a remote use of opiods but has been on suboxone and has nearly weaned off of that.  No alcohol abuse.    Patient does have documented immunity to Hepatitis A. Patient does have documented immunity to Hepatitis B.     Review of Systems A comprehensive review of systems was negative.   No past medical history on file.  Prior to Admission medications   Medication Sig Start Date End Date Taking? Authorizing Provider  buprenorphine-naloxone (SUBOXONE) 8-2 MG SUBL Place under the tongue.   Yes Historical Provider, MD    No Known Allergies  History  Substance Use Topics  . Smoking status: Current Every Day Smoker -- 1.00 packs/day    Types: Cigarettes    Start date: 02/06/2003  . Smokeless tobacco: Never Used     Comment: cutting back  . Alcohol Use: No     Comment: rarely    No family history on file.    Objective:   Filed Vitals:   03/31/14 1549  BP: 131/80  Pulse: 79  Temp: 98.3 F (36.8 C)   in no apparent distress and alert HEENT: anicteric Cor RRR and No murmurs clear Bowel sounds are normal, liver is not enlarged, spleen is not enlarged peripheral pulses normal, no pedal edema, no clubbing or cyanosis negative for - jaundice, spider hemangioma, telangiectasia, palmar erythema, ecchymosis and  atrophy  Laboratory Genotype:  Lab Results  Component Value Date   HCVGENOTYPE 1 03/09/2014   HCV viral load:  Lab Results  Component Value Date   HCVQUANT 1745* 03/02/2014   Lab Results  Component Value Date   WBC 7.5 03/02/2014   HGB 15.3 03/02/2014   HCT 43.9 03/02/2014   MCV 87.1 03/02/2014   PLT 288 03/02/2014    Lab Results  Component Value Date   CREATININE 0.90 03/02/2014   BUN 14 03/02/2014   NA 137 03/02/2014   K 4.4 03/02/2014   CL 103 03/02/2014   CO2 27 03/02/2014    Lab Results  Component Value Date   ALT 23 03/02/2014   AST 19 03/02/2014   ALKPHOS 57 03/02/2014   BILITOT 0.6 03/02/2014   INR 1.00 03/09/2014      Assessment: Chronic Hepatitis C genotype 1  Plan: 1) Patient counseled extensively on limiting acetaminophen to no more than 2 grams daily, avoidance of alcohol. 2) Transmission discussed with patient including sexual transmission, sharing razors and toothbrush.   3) Will need referral to gastroenterology if concern for cirrhosis 4) Will need referral for substance abuse counseling: No. 5) Will prescribe Harvoni for 12 weeks once work up complete 6) Hepatitis A vaccine No. 7) Hepatitis B vaccine No. 8) Pneumovax vaccine if concern for cirrhosis 9) will follow up after elastography

## 2014-03-31 NOTE — Patient Instructions (Addendum)
Date 03/31/2014  Dear Mr Dawna PartMarks, As discussed in the ID Clinic, your hepatitis C therapy will include the following medications:          Harvoni 90mg /400mg  tablet:           Take 1 tablet by mouth once daily   Please note that ALL MEDICATIONS WILL START ON THE SAME DATE for a total of 12 weeks. ---------------------------------------------------------------- Your HCV Treatment Start Date: TBA   Your HCV genotype:  1    Liver Fibrosis: TBD    ---------------------------------------------------------------- YOUR PHARMACY CONTACT:   [facility] [address] Phone: [phone] Hours: Monday to Friday 7:30 am to 6:00 pm   Please always contact your pharmacy at least 3-4 business days before you run out of medications to ensure your next month's medication is ready or 1 week prior to running out if you receive it by mail.  Remember, each prescription is for 28 days. ---------------------------------------------------------------- GENERAL NOTES REGARDING YOUR HEPATITIS C MEDICATION:  SOFOSBUVIR/LEDIPASVIR (HARVONI): - Harvoni tablet is taken daily with OR without food. - The tablets are orange. - The tablets should be stored at room temperature.  - Acid reducing agents such as H2 blockers (ie. Pepcid (famotidine), Zantac (ranitidine), Tagamet (cimetidine), Axid (nizatidine) and proton pump inhibitors (ie. Prilosec (omeprazole), Protonix (pantoprazole), Nexium (esomeprazole), or Aciphex (rabeprazole)) can decrease effectiveness of Harvoni. Do not take until you have discussed with a health care provider.    -Antacids that contain magnesium and/or aluminum hydroxide (ie. Milk of Magensia, Rolaids, Gaviscon, Maalox, Mylanta, an dArthritis Pain Formula)can reduce absorption of Harvoni, so take them at least 4 hours before or after Harvoni.  -Calcium carbonate (calcium supplements or antacids such as Tums, Caltrate,  Os-Cal)needs to be taken at least 4 hours hours before or after Harvoni.  -St. John's wort or any products that contain St. John's wort like some herbal supplements  Please inform the office prior to starting any of these medications.  - The common side effects with Harvoni:      1. Fatigue      2. Headache      3. Nausea      4. Diarrhea      5. Insomnia   Support Path is a suite of resources designed to help patients start with HARVONI and move toward treatment completion GETTING STARTED Support Path helps patients access therapy and get off to an efficient start  Benefits investigation and prior authorization support Co-pay and other financial assistance A specialty pharmacy finder CO-PAY COUPON The HARVONI co-pay coupon may help eligible patients lower their out-of-pocket costs. With a co-pay coupon, most eligible patients may pay no more than $5 per co-pay (restrictions apply) www.harvoni.com call 416-584-69701-937-174-0367 Not valid for patients enrolled in government healthcare prescription drug programs, such as Medicare Part D and Medicaid. Patients in the coverage gap known as the "donut hole" also are not eligible The HARVONI co-pay coupon program will cover the out-of-pocket costs for HARVONI prescriptions up to a maximum of 25% of the catalog price of a 12-week regimen of HARVONI  Please note that this only lists the most common side effects and is NOT a comprehensive list of the potential side effects of these medications. For more information, please review the drug information sheets that come with your medication package from the pharmacy.  ---------------------------------------------------------------- GENERAL HELPFUL HINTS ON HCV THERAPY: 1. No alcohol. 2. Protect against sun-sensitivity/sunburns (wear sunglasses, hat, long sleeves, pants  and sunscreen). 3. Stay well-hydrated/well-moisturized. 4. Notify the ID Clinic of any changes in your other over-the-counter/herbal or  prescription medications. 5. If you miss a dose of your medication, take the missed dose as soon as you remember. Return to your regular time/dose schedule the next day.  6.  Do not stop taking your medications without first talking with your healthcare provider. 7.  You may take Tylenol (acetaminophen), as long as the dose is less than 2000 mg (OR no more than 4 tablets of the Tylenol Extra Strengths 500mg  tablet) in 24 hours. 8.  You will need to obtain routine labs and/or office visits at [facility] at weeks 2, 4, 8,  and 12 as well as 12 and 24 weeks after completion of treatment.   Scharlene Gloss, [place] for [address] [place], [place]  [phone]

## 2014-04-01 ENCOUNTER — Ambulatory Visit: Payer: BLUE CROSS/BLUE SHIELD | Admitting: Internal Medicine

## 2014-04-07 ENCOUNTER — Ambulatory Visit (HOSPITAL_COMMUNITY): Payer: BLUE CROSS/BLUE SHIELD

## 2014-04-07 ENCOUNTER — Other Ambulatory Visit: Payer: Self-pay | Admitting: Internal Medicine

## 2014-04-07 ENCOUNTER — Encounter (INDEPENDENT_AMBULATORY_CARE_PROVIDER_SITE_OTHER): Payer: Self-pay

## 2014-04-07 ENCOUNTER — Ambulatory Visit (HOSPITAL_COMMUNITY)
Admission: RE | Admit: 2014-04-07 | Discharge: 2014-04-07 | Disposition: A | Discharge status: Home / Self Care | Payer: BLUE CROSS/BLUE SHIELD | Source: Ambulatory Visit | Attending: Internal Medicine | Admitting: Internal Medicine

## 2014-04-07 DIAGNOSIS — B182 Chronic viral hepatitis C: Secondary | ICD-10-CM | POA: Insufficient documentation

## 2014-04-07 MED ORDER — LEDIPASVIR-SOFOSBUVIR 90-400 MG PO TABS: 1.0000 | ORAL_TABLET | Freq: Every day | ORAL | Status: DC

## 2014-04-14 ENCOUNTER — Ambulatory Visit: Payer: BLUE CROSS/BLUE SHIELD | Admitting: Internal Medicine

## 2014-04-27 ENCOUNTER — Ambulatory Visit: Payer: BLUE CROSS/BLUE SHIELD | Admitting: Emergency Medicine

## 2014-04-28 ENCOUNTER — Other Ambulatory Visit: Payer: Self-pay | Admitting: Pharmacist Clinician (PhC)/ Clinical Pharmacy Specialist

## 2014-04-28 MED ORDER — LEDIPASVIR-SOFOSBUVIR 90-400 MG PO TABS: 1.0000 | ORAL_TABLET | Freq: Every day | ORAL | Status: DC

## 2014-04-29 ENCOUNTER — Ambulatory Visit (HOSPITAL_COMMUNITY): Payer: BLUE CROSS/BLUE SHIELD

## 2014-04-29 ENCOUNTER — Ambulatory Visit (INDEPENDENT_AMBULATORY_CARE_PROVIDER_SITE_OTHER): Payer: BLUE CROSS/BLUE SHIELD | Admitting: Internal Medicine

## 2014-04-29 ENCOUNTER — Encounter: Payer: Self-pay | Admitting: Internal Medicine

## 2014-04-29 VITALS — BP 117/70 | HR 78 | Temp 98.3°F | Ht 67.0 in | Wt 133.5 lb

## 2014-04-29 DIAGNOSIS — F1921 Other psychoactive substance dependence, in remission: Secondary | ICD-10-CM | POA: Diagnosis not present

## 2014-04-29 DIAGNOSIS — B182 Chronic viral hepatitis C: Secondary | ICD-10-CM | POA: Diagnosis not present

## 2014-04-29 NOTE — Assessment & Plan Note (Signed)
He continues to do well off of drugs.

## 2014-04-29 NOTE — Progress Notes (Signed)
   Subjective:    Patient ID: Jermaine Hood, male    DOB: [birth year], 29 y.o.   MRN: [id number]  HPI He is here for follow-up of hepatitis C. He has genotype 1 with a viral load of just 1700. He is hepatitis A and B immune. He did have his recent elastography with the fibrosis score of 2-3. He was just approved for Harvoni for 8 weeks. He will be receiving this from specialty pharmacy.   Review of Systems  Constitutional: Negative for fatigue.  Gastrointestinal: Negative for nausea and diarrhea.  Skin: Negative for rash.  Neurological: Negative for dizziness and light-headedness.       Objective:   Physical Exam  Constitutional: He appears well-developed and well-nourished. No distress.  Eyes: No scleral icterus.  Cardiovascular: Normal rate, regular rhythm and normal heart sounds.   No murmur heard. Pulmonary/Chest: Effort normal and breath sounds normal. No respiratory distress.  Skin: No rash noted.          Assessment & Plan:

## 2014-04-29 NOTE — Assessment & Plan Note (Signed)
He should be starting his Harvoni this week or by next week. We'll check his labs in 4 weeks to be sure that he is doing well. I will see him 1 week after that. He was approved for just 8 weeks which has good efficacy in people with a viral load under 6 million. His is actually very low in comparison. Therefore 8 weeks should be adequate. I also discussed with him the findings with elastography. He does have some liver damage and discussed continued need to be good to his liver including no alcohol and limiting things like Tylenol. He voices understanding and is pleased start the medication. I will repeat his elastography in about one year.

## 2014-05-04 ENCOUNTER — Encounter: Payer: Self-pay | Admitting: Emergency Medicine

## 2014-05-04 ENCOUNTER — Ambulatory Visit (INDEPENDENT_AMBULATORY_CARE_PROVIDER_SITE_OTHER): Payer: BLUE CROSS/BLUE SHIELD | Admitting: Emergency Medicine

## 2014-05-04 VITALS — BP 101/72 | HR 76 | Temp 98.0°F | Resp 16 | Ht 67.0 in | Wt 131.0 lb

## 2014-05-04 DIAGNOSIS — B1920 Unspecified viral hepatitis C without hepatic coma: Secondary | ICD-10-CM

## 2014-05-04 NOTE — Progress Notes (Deleted)
   Subjective:    Patient ID: Jermaine Hood, male    DOB: [birth year], 29 y.o.   MRN: [id number]  HPI    Review of Systems     Objective:   Physical Exam        Assessment & Plan:

## 2014-05-04 NOTE — Progress Notes (Signed)
   Subjective:  This chart was scribed for Collene GobbleSteven A Kabella Cassidy, MD by Charline BillsEssence Howell, ED Scribe. The patient was seen in room 23. Patient's care was started at 4:22 PM.   Patient ID: Jermaine Hood, male    DOB: Jul 27, 1985, 29 y.o.   MRN: [id number]  Chief Complaint  Patient presents with  . Follow-up  . Advice Only    pt would not discuss details   HPI  HPI Comments: Jermaine Hood is a 29 y.o. male, with a h/o chronic hepatitis C, who presents to the [facility] for follow-up regarding Hepatitis C. Pt states that he is "really grateful and very fortunate" for the referral to Infectious Disease. Harvoni is scheduled to arrive at his house in 3 days on 05/07/14. Pt denies recent alcohol use.   Pt is currently taking 1 mg Suboxone every other day but states that his prescription expires at the end of this month.   No past medical history on file.  Current Outpatient Prescriptions on File Prior to Visit  Medication Sig Dispense Refill  . buprenorphine-naloxone (SUBOXONE) 8-2 MG SUBL Place under the tongue.    . Ledipasvir-Sofosbuvir (HARVONI) 90-400 MG TABS Take 1 tablet by mouth daily. 28 tablet 2   No current facility-administered medications on file prior to visit.   No Known Allergies  Review of Systems     Objective:   Physical Exam CONSTITUTIONAL: Well developed/well nourished HEAD: Normocephalic/atraumatic EYES: EOMI/PERRL ENMT: Mucous membranes moist NECK: supple no meningeal signs SPINE/BACK:entire spine nontender CV: S1/S2 noted, no murmurs/rubs/gallops noted LUNGS: Lungs are clear to auscultation bilaterally, no apparent distress ABDOMEN: soft, nontender, no rebound or guarding, bowel sounds noted throughout abdomen GU:no cva tenderness NEURO: Pt is awake/alert/appropriate, moves all extremitiesx4.  No facial droop.   EXTREMITIES: pulses normal/equal, full ROM SKIN: warm, color normal PSYCH: no abnormalities of mood noted, alert and oriented to  situation    Assessment & Plan:   Patient has been able to maintain his sobriety. He is due to start her body treatment for his hep C. He is continuing to wean off of his Suboxone. He does not currently go to meetings. He has stayed busy at work. I told him he needed to return to clinic if he felt the need to use or developed any other problems.I personally performed the services described in this documentation, which was scribed in my presence. The recorded information has been reviewed and is accurate.

## 2014-05-27 ENCOUNTER — Other Ambulatory Visit (INDEPENDENT_AMBULATORY_CARE_PROVIDER_SITE_OTHER): Payer: BLUE CROSS/BLUE SHIELD

## 2014-05-27 DIAGNOSIS — B182 Chronic viral hepatitis C: Secondary | ICD-10-CM

## 2014-05-27 LAB — CBC WITH DIFFERENTIAL/PLATELET
BASOS ABS: 0 10*3/uL (ref 0.0–0.1)
Basophils Relative: 0 % (ref 0–1)
EOS PCT: 1 % (ref 0–5)
Eosinophils Absolute: 0.1 10*3/uL (ref 0.0–0.7)
HCT: 44.9 % (ref 39.0–52.0)
Hemoglobin: 15.4 g/dL (ref 13.0–17.0)
Lymphocytes Relative: 36 % (ref 12–46)
Lymphs Abs: 2.6 10*3/uL (ref 0.7–4.0)
MCH: 30 pg (ref 26.0–34.0)
MCHC: 34.3 g/dL (ref 30.0–36.0)
MCV: 87.4 fL (ref 78.0–100.0)
MPV: 10.1 fL (ref 8.6–12.4)
Monocytes Absolute: 0.5 10*3/uL (ref 0.1–1.0)
Monocytes Relative: 7 % (ref 3–12)
NEUTROS PCT: 56 % (ref 43–77)
Neutro Abs: 4.1 10*3/uL (ref 1.7–7.7)
PLATELETS: 254 10*3/uL (ref 150–400)
RBC: 5.14 MIL/uL (ref 4.22–5.81)
RDW: 13.2 % (ref 11.5–15.5)
WBC: 7.3 10*3/uL (ref 4.0–10.5)

## 2014-05-27 LAB — COMPLETE METABOLIC PANEL WITH GFR
ALT: 25 U/L (ref 0–53)
AST: 23 U/L (ref 0–37)
Albumin: 4.3 g/dL (ref 3.5–5.2)
Alkaline Phosphatase: 52 U/L (ref 39–117)
BUN: 19 mg/dL (ref 6–23)
CALCIUM: 9.7 mg/dL (ref 8.4–10.5)
CO2: 28 mEq/L (ref 19–32)
CREATININE: 1.01 mg/dL (ref 0.50–1.35)
Chloride: 101 mEq/L (ref 96–112)
GFR, Est African American: >89 mL/min
GFR, Est Non African American: >89 mL/min
Glucose, Bld: 85 mg/dL (ref 70–99)
Potassium: 3.9 mEq/L (ref 3.5–5.3)
SODIUM: 135 meq/L (ref 135–145)
Total Bilirubin: 0.9 mg/dL (ref 0.2–1.2)
Total Protein: 7.4 g/dL (ref 6.0–8.3)

## 2014-05-28 LAB — HEPATITIS C RNA QUANTITATIVE: HCV Quantitative Log: <1.18 {Log} (ref <1.18)

## 2014-06-03 ENCOUNTER — Encounter: Payer: Self-pay | Admitting: Internal Medicine

## 2014-06-03 ENCOUNTER — Ambulatory Visit (INDEPENDENT_AMBULATORY_CARE_PROVIDER_SITE_OTHER): Payer: BLUE CROSS/BLUE SHIELD | Admitting: Internal Medicine

## 2014-06-03 VITALS — BP 131/82 | HR 76 | Temp 98.1°F | Ht 67.0 in | Wt 133.0 lb

## 2014-06-03 DIAGNOSIS — B182 Chronic viral hepatitis C: Secondary | ICD-10-CM

## 2014-06-03 NOTE — Progress Notes (Signed)
   Subjective:    Patient ID: Jermaine Hood, male    DOB: [birth year], 29 y.o.   MRN: [id number]  HPI He is here for follow-up of hepatitis C. He has genotype 1 with a viral load of just 1700. He is hepatitis A and B immune. He did have his recent elastography with the fibrosis score of 2-3. He was approved for Harvoni for 8 weeks and has completed over 4 weeks. He receives this from a specialty pharmacy.   Review of Systems  Constitutional: Negative for fatigue.  Gastrointestinal: Negative for nausea and diarrhea.  Skin: Negative for rash.  Neurological: Negative for dizziness and light-headedness.       Objective:   Physical Exam  Constitutional: He appears well-developed and well-nourished. No distress.  Eyes: No scleral icterus.  Cardiovascular: Normal rate, regular rhythm and normal heart sounds.   No murmur heard. Pulmonary/Chest: Effort normal and breath sounds normal. No respiratory distress.  Skin: No rash noted.          Assessment & Plan:

## 2014-06-03 NOTE — Assessment & Plan Note (Signed)
Doing well and tells me he was approved for 12 weeks total.  He will return after treatment completion and recheck end of treatment lab.    Off suboxone completely.  F2-3, will recheck elastography in about 1 year.

## 2014-06-30 ENCOUNTER — Telehealth: Payer: Self-pay | Admitting: *Deleted

## 2014-06-30 NOTE — Telephone Encounter (Signed)
They only approved him for 8 wks. I think the appt is fine.

## 2014-06-30 NOTE — Telephone Encounter (Signed)
Pt only received Harvoni for 60 days.  Wondering if he should come in earlier than 08/05/14 for lab appt.  Next appt w/ Dr. Luciana Axeomer is 08/12/14.  Please respond to the patient.

## 2014-08-05 ENCOUNTER — Other Ambulatory Visit: Payer: BLUE CROSS/BLUE SHIELD

## 2014-08-05 DIAGNOSIS — B182 Chronic viral hepatitis C: Secondary | ICD-10-CM

## 2014-08-10 LAB — HEPATITIS C RNA QUANTITATIVE: HCV QUANT: NOT DETECTED [IU]/mL (ref <15)

## 2014-08-17 ENCOUNTER — Ambulatory Visit (INDEPENDENT_AMBULATORY_CARE_PROVIDER_SITE_OTHER): Payer: BLUE CROSS/BLUE SHIELD | Admitting: Internal Medicine

## 2014-08-17 ENCOUNTER — Encounter: Payer: Self-pay | Admitting: Internal Medicine

## 2014-08-17 VITALS — BP 123/69 | HR 75 | Temp 98.8°F | Wt 135.0 lb

## 2014-08-17 DIAGNOSIS — B182 Chronic viral hepatitis C: Secondary | ICD-10-CM | POA: Diagnosis not present

## 2014-08-17 NOTE — Assessment & Plan Note (Signed)
He remains undetectable.  [facility] have [facility] and if still negative, will be considered cured.   Will also repeat elastography 6 months after that to compare to first and that will help determine if he needs continued screening.

## 2014-08-17 NOTE — Progress Notes (Signed)
   Subjective:    Patient ID: Jermaine Hood, male    DOB: 1985/07/24, 29 y.o.   MRN: [id number]  HPI He is here for follow-up of hepatitis C. He has genotype 1 with a viral load of just 1700. He is hepatitis A and B immune. His elastography with the fibrosis score of 2-3. [facility] now completed treatment.  End of treatment viral load remains undetectable.  Feels well and pleased with results.    Review of Systems  Constitutional: Negative for fatigue.  Gastrointestinal: Negative for nausea and diarrhea.  Skin: Negative for rash.  Neurological: Negative for dizziness and light-headedness.       Objective:   Physical Exam  Constitutional: He appears well-developed and well-nourished. No distress.  Eyes: No scleral icterus.  Cardiovascular: Normal rate, regular rhythm and normal heart sounds.   No murmur heard. Pulmonary/Chest: Effort normal and breath sounds normal. No respiratory distress.  Skin: No rash noted.          Assessment & Plan:

## 2014-08-31 ENCOUNTER — Ambulatory Visit: Payer: BLUE CROSS/BLUE SHIELD | Admitting: Emergency Medicine

## 2014-11-09 ENCOUNTER — Encounter: Payer: Self-pay | Admitting: Emergency Medicine

## 2014-12-08 ENCOUNTER — Other Ambulatory Visit: Payer: BLUE CROSS/BLUE SHIELD

## 2014-12-13 ENCOUNTER — Other Ambulatory Visit: Payer: BLUE CROSS/BLUE SHIELD

## 2014-12-23 ENCOUNTER — Other Ambulatory Visit: Payer: BLUE CROSS/BLUE SHIELD

## 2014-12-23 DIAGNOSIS — B182 Chronic viral hepatitis C: Secondary | ICD-10-CM

## 2014-12-26 LAB — HEPATITIS C RNA QUANTITATIVE: HCV Quantitative: NOT DETECTED IU/mL (ref <15)

## 2014-12-28 ENCOUNTER — Ambulatory Visit: Payer: BLUE CROSS/BLUE SHIELD | Admitting: Internal Medicine

## 2015-02-08 ENCOUNTER — Encounter: Payer: Self-pay | Admitting: Internal Medicine

## 2015-02-08 ENCOUNTER — Ambulatory Visit (INDEPENDENT_AMBULATORY_CARE_PROVIDER_SITE_OTHER): Payer: 59 | Admitting: Internal Medicine

## 2015-02-08 VITALS — BP 118/72 | HR 68 | Temp 98.1°F | Ht 67.0 in | Wt 139.8 lb

## 2015-02-08 DIAGNOSIS — K7400 Hepatic fibrosis, unspecified: Secondary | ICD-10-CM | POA: Insufficient documentation

## 2015-02-08 DIAGNOSIS — B182 Chronic viral hepatitis C: Secondary | ICD-10-CM

## 2015-02-08 NOTE — Progress Notes (Signed)
   Subjective:    Patient ID: Jermaine Hood, male    DOB: [birth year], 30 y.o.   MRN: [id number]  HPI He is here for follow-up of hepatitis C.  He has genotype 1 with a viral load of just 1700. He is hepatitis A and B immune. His elastography with the fibrosis score of 2-3. He was approved for Harvoni for 8 weeks and completed treatment about 6 months ago.  SVR24 undetectable and now considered cured.    Review of Systems  Constitutional: Negative for fatigue.  Gastrointestinal: Negative for nausea and diarrhea.  Skin: Negative for rash.  Neurological: Negative for dizziness and light-headedness.       Objective:   Physical Exam  Constitutional: He appears well-developed and well-nourished. No distress.  Eyes: No scleral icterus.  Cardiovascular: Normal rate, regular rhythm and normal heart sounds.   No murmur heard. Pulmonary/Chest: Effort normal and breath sounds normal. No respiratory distress.  Skin: No rash noted.          Assessment & Plan:

## 2015-02-08 NOTE — Assessment & Plan Note (Signed)
I will repeat his elastography in about 4-5 months after his return to compare.

## 2015-02-08 NOTE — Assessment & Plan Note (Signed)
Now officially considered cured!

## 2015-06-09 ENCOUNTER — Ambulatory Visit: Payer: BLUE CROSS/BLUE SHIELD | Admitting: Emergency Medicine

## 2015-06-09 ENCOUNTER — Ambulatory Visit: Payer: 59 | Admitting: Internal Medicine

## 2015-09-06 ENCOUNTER — Ambulatory Visit (INDEPENDENT_AMBULATORY_CARE_PROVIDER_SITE_OTHER): Payer: 59 | Admitting: Family Medicine

## 2015-09-06 VITALS — BP 114/76 | HR 65 | Temp 97.7°F | Resp 17 | Ht 67.0 in | Wt 141.0 lb

## 2015-09-06 DIAGNOSIS — R591 Generalized enlarged lymph nodes: Secondary | ICD-10-CM

## 2015-09-06 DIAGNOSIS — R599 Enlarged lymph nodes, unspecified: Secondary | ICD-10-CM

## 2015-09-06 LAB — POCT CBC
Granulocyte percent: 73.4 %G (ref 37–80)
HCT, POC: 39.1 % — AB (ref 43.5–53.7)
HEMOGLOBIN: 13.5 g/dL — AB (ref 14.1–18.1)
LYMPH, POC: 1.8 (ref 0.6–3.4)
MCH, POC: 29.5 pg (ref 27–31.2)
MCHC: 34.6 g/dL (ref 31.8–35.4)
MCV: 85.3 fL (ref 80–97)
MID (cbc): 0.5 (ref 0–0.9)
MPV: 7.3 fL (ref 0–99.8)
POC GRANULOCYTE: 6.2 (ref 2–6.9)
POC LYMPH PERCENT: 20.6 %L (ref 10–50)
POC MID %: 6 %M (ref 0–12)
Platelet Count, POC: 256 10*3/uL (ref 142–424)
RBC: 4.58 M/uL — AB (ref 4.69–6.13)
RDW, POC: 12.9 %
WBC: 8.5 10*3/uL (ref 4.6–10.2)

## 2015-09-06 LAB — POCT URINALYSIS DIP (MANUAL ENTRY)
Bilirubin, UA: NEGATIVE
Blood, UA: NEGATIVE
Glucose, UA: NEGATIVE
Ketones, POC UA: NEGATIVE
LEUKOCYTES UA: NEGATIVE
NITRITE UA: NEGATIVE
PH UA: 7
PROTEIN UA: NEGATIVE
Spec Grav, UA: 1.015
Urobilinogen, UA: 0.2

## 2015-09-06 LAB — POC MICROSCOPIC URINALYSIS (UMFC): Mucus: ABSENT

## 2015-09-06 NOTE — Progress Notes (Signed)
   Patient ID: Jermaine Hood, male    DOB: 1985/10/05, 30 y.o.   MRN: [id number]  PCP: Lucilla Edin, MD  Chief Complaint  Patient presents with  . Hernia    Since thursday     Subjective:   HPI Presents for evaluation of noticing what he describes as a hernia below his waistline times 5 days ago. Localized to left side femoral region. Denies noticing any swelling on the right side. Painful 5/10, worst in the morning after awakening, standing, bending, or lifting. He lifts heavy objects daily at his current employer. Does not recall lifting any recent objects heavier than normal. Reports no nausea, vomiting, or fever. No bruising.Never had a hernia previously. Reports unprotected sex with the same partner. Denies recent direct injury to groin area.  Review of Systems  Constitutional: Negative.   Respiratory: Negative.   Cardiovascular: Negative.   Gastrointestinal: Negative for abdominal pain, constipation, diarrhea, nausea, rectal pain and vomiting.  Genitourinary: Negative for difficulty urinating, discharge, dysuria, flank pain, frequency, hematuria, penile pain, penile swelling, scrotal swelling, testicular pain and urgency.       Patient Active Problem List   Diagnosis Date Noted  . Liver fibrosis (HCC) 02/08/2015  . Chronic hepatitis C without hepatic coma (HCC) 03/02/2014  . 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 He appears well-developed and well-nourished.  HENT:  Head: Normocephalic and atraumatic.  Right Ear: External ear normal.  Left Ear: External ear normal.  Eyes: Conjunctivae and EOM are normal. Pupils are equal, round, and reactive to light.  Neck: Normal range of motion. Neck supple.  Cardiovascular: Normal rate, regular rhythm, normal heart sounds and intact distal  pulses.   Pulmonary/Chest: Effort normal and breath sounds normal.  Abdominal: He exhibits mass. He exhibits no distension. There is tenderness. There is no rebound and no guarding.  Superior to visibly large nodule is a smaller palpable nodule that is non-tender and non-reducable. Enlarged left femoral nodule which palpable, tender with palpation. Noninflammed or nonerythemtous  Genitourinary: Penis normal. No penile tenderness.  Musculoskeletal: Normal range of motion.  Neurological: He is alert and oriented to person, place, and time.  Skin: Skin is warm and dry.          Assessment & Plan:  .1. Enlarged lymph nodes - POCT urinalysis dipstick - POCT Microscopic Urinalysis ([facility]) - GC/Chlamydia Probe Amp - POCT CBC-unremarkable  Patient presents today with complaint of an enlarge node of the left femoral groin. Node is tender to touch, non-mobile. STD screenings test are pending. Advised that if nodes remains enlarged in two weeks, STD screenings are negative,  I will consider at that time, ordering an ultrasound of left femoral groin.   Godfrey Pick. Tiburcio Pea, MSN, FNP-C [facility]

## 2015-09-06 NOTE — Patient Instructions (Addendum)
Return to the office in 2 weeks if lymph node remains present.  Take Ibuprofen or Naproxen for pain.    IF you received an x-ray today, you will receive an invoice from [facility]. Please contact [facility] at [phone] with questions or concerns regarding your invoice.   IF you received labwork today, you will receive an invoice from [employer]. Please contact [facility] at [phone] with questions or concerns regarding your invoice.   Our billing staff will not be able to assist you with questions regarding bills from these companies.  You will be contacted with the lab results as soon as they are available. The fastest way to get your results is to activate your My Chart account. Instructions are located on the last page of this paperwork. If you have not heard from [place] regarding the results in 2 weeks, please contact this office.     Lymphadenopathy Lymphadenopathy refers to swollen or enlarged lymph glands, also called lymph nodes. Lymph glands are part of your body's defense (immune) system, which protects the body from infections, germs, and diseases. Lymph glands are found in many locations in your body, including the neck, underarm, and groin.  Many things can cause lymph glands to become enlarged. When your immune system responds to germs, such as viruses or bacteria, infection-fighting cells and fluid build up. This causes the glands to grow in size. Usually, this is not something to worry about. The swelling and any soreness often go away without treatment. However, swollen lymph glands can also be caused by a number of diseases. Your health care provider may do various tests to help determine the cause. If the cause of your swollen lymph glands cannot be found, it is important to monitor your condition to make sure the swelling goes away. HOME CARE INSTRUCTIONS Watch your condition for any changes. The following actions may help to  lessen any discomfort you are feeling:  Get plenty of rest.  Take medicines only as directed by your health care provider. Your health care provider may recommend over-the-counter medicines for pain.  Apply moist heat compresses to the site of swollen lymph nodes as directed by your health care provider. This can help reduce any pain.  Check your lymph nodes daily for any changes.  Keep all follow-up visits as directed by your health care provider. This is important. SEEK MEDICAL CARE IF:  Your lymph nodes are still swollen after 2 weeks.  Your swelling increases or spreads to other areas.  Your lymph nodes are hard, seem fixed to the skin, or are growing rapidly.  Your skin over the lymph nodes is red and inflamed.  You have a fever.  You have chills.  You have fatigue.  You develop a sore throat.  You have abdominal pain.  You have weight loss.  You have night sweats. SEEK IMMEDIATE MEDICAL CARE IF:  You notice fluid leaking from the area of the enlarged lymph node.  You have severe pain in any area of your body.  You have chest pain.  You have shortness of breath.   This information is not intended to replace advice given to you by your health care provider. Make sure you discuss any questions you have with your health care provider.   Document Released: 11/01/2007 Document Revised: 02/12/2014 Document Reviewed: 08/27/2013 Elsevier Interactive Patient Education [employer]

## 2015-09-07 LAB — GC/CHLAMYDIA PROBE AMP
CT Probe RNA: NOT DETECTED
GC Probe RNA: NOT DETECTED

## 2015-09-07 LAB — RPR

## 2015-09-07 LAB — HIV ANTIBODY (ROUTINE TESTING W REFLEX): HIV 1&2 Ab, 4th Generation: NONREACTIVE

## 2015-10-22 ENCOUNTER — Encounter (HOSPITAL_COMMUNITY): Payer: Self-pay | Admitting: *Deleted

## 2015-10-22 ENCOUNTER — Emergency Department (HOSPITAL_COMMUNITY)
Admission: EM | Admit: 2015-10-22 | Discharge: 2015-10-22 | Disposition: A | Discharge status: Home / Self Care | Payer: 59 | Attending: Emergency Medicine | Admitting: Emergency Medicine

## 2015-10-22 DIAGNOSIS — Y939 Activity, unspecified: Secondary | ICD-10-CM | POA: Insufficient documentation

## 2015-10-22 DIAGNOSIS — Y92410 Unspecified street and highway as the place of occurrence of the external cause: Secondary | ICD-10-CM | POA: Diagnosis not present

## 2015-10-22 DIAGNOSIS — Y999 Unspecified external cause status: Secondary | ICD-10-CM | POA: Diagnosis not present

## 2015-10-22 DIAGNOSIS — S199XXA Unspecified injury of neck, initial encounter: Secondary | ICD-10-CM | POA: Diagnosis present

## 2015-10-22 DIAGNOSIS — S134XXA Sprain of ligaments of cervical spine, initial encounter: Secondary | ICD-10-CM | POA: Diagnosis not present

## 2015-10-22 DIAGNOSIS — F17210 Nicotine dependence, cigarettes, uncomplicated: Secondary | ICD-10-CM | POA: Diagnosis not present

## 2015-10-22 MED ORDER — HYDROCODONE-ACETAMINOPHEN 5-325 MG PO TABS: 1.0000 | ORAL_TABLET | Freq: Four times a day (QID) | ORAL | 0 refills | Status: DC | PRN

## 2015-10-22 MED ORDER — CYCLOBENZAPRINE HCL 10 MG PO TABS: 10.0000 mg | ORAL_TABLET | Freq: Two times a day (BID) | ORAL | 0 refills | Status: DC | PRN
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
## 2016-03-28 ENCOUNTER — Ambulatory Visit (INDEPENDENT_AMBULATORY_CARE_PROVIDER_SITE_OTHER): Payer: 59 | Admitting: Emergency Medicine

## 2016-03-28 VITALS — BP 128/78 | HR 98 | Temp 98.6°F | Resp 16 | Ht 67.0 in | Wt 154.8 lb

## 2016-03-28 DIAGNOSIS — A6001 Herpesviral infection of penis: Secondary | ICD-10-CM | POA: Insufficient documentation

## 2016-03-28 MED ORDER — VALACYCLOVIR HCL 1 G PO TABS: 1000.0000 mg | ORAL_TABLET | Freq: Two times a day (BID) | ORAL | 3 refills | Status: DC

## 2016-03-28 NOTE — Progress Notes (Signed)
Jermaine Hood 30 y.o.   Chief Complaint  Patient presents with  . Rash    possible skin infection in genital area, states looks like posion ivy since yesterday     HISTORY OF PRESENT ILLNESS: This is a 31 y.o. male complaining of painful rash to genital area x 2-3 days. Rash  This is a new problem. The current episode started in the past 7 days. The problem has been gradually worsening since onset. The affected locations include the genitalia. The rash is characterized by blistering, itchiness and burning. Pertinent negatives include no anorexia, congestion, cough, fever, shortness of breath, sore throat or vomiting.     Prior to Admission medications   Medication Sig Start Date End Date Taking? Authorizing Provider  cyclobenzaprine (FLEXERIL) 10 MG tablet Take 1 tablet (10 mg total) by mouth 2 (two) times daily as needed for muscle spasms. Patient not taking: Reported on 03/28/2016 10/22/15   Roxy Horseman, PA-C  HYDROcodone-acetaminophen (NORCO/VICODIN) 5-325 MG tablet Take 1-2 tablets by mouth every 6 (six) hours as needed. Patient not taking: Reported on 03/28/2016 10/22/15   Roxy Horseman, PA-C  valACYclovir (VALTREX) 1000 MG tablet Take 1 tablet (1,000 mg total) by mouth 2 (two) times daily. 03/28/16   Georgina Quint, MD    No Known Allergies  Patient Active Problem List   Diagnosis Date Noted  . Liver fibrosis (HCC) 02/08/2015  . Chronic hepatitis C without hepatic coma (HCC) 03/02/2014  . Substance dependence, in remission (HCC) 03/26/2013    History reviewed. No pertinent past medical history.  Past Surgical History:  Procedure Laterality Date  . APPENDECTOMY      Social History   Social History  . Marital status: Single    Spouse name: N/A  . Number of children: N/A  . Years of education: N/A   Occupational History  . Not on file.   Social History Main Topics  . Smoking status: Current Every Day Smoker    Packs/day: 1.00    Types: Cigarettes    Start date: 02/06/2003  . Smokeless tobacco: Never Used     Comment: cutting back  . Alcohol use No     Comment: rarely  . Drug use: Yes    Types: Marijuana  . Sexual activity: Not on file   Other Topics Concern  . Not on file   Social History Narrative  . No narrative on file    History reviewed. No pertinent family history.   Review of Systems  Constitutional: Negative for fever.  HENT: Negative for congestion and sore throat.   Eyes: Negative for discharge and redness.  Respiratory: Negative for cough and shortness of breath.   Gastrointestinal: Negative for anorexia, nausea and vomiting.  Genitourinary: Negative for dysuria and hematuria.  Musculoskeletal: Negative for myalgias.  Skin: Positive for rash.  All other systems reviewed and are negative.  Vitals:   03/28/16 1210  BP: 128/78  Pulse: 98  Resp: 16  Temp: 98.6 F (37 C)     Physical Exam  Constitutional: He is oriented to person, place, and time. He appears well-developed and well-nourished.  HENT:  Head: Normocephalic and atraumatic.  Eyes: Conjunctivae and EOM are normal. Pupils are equal, round, and reactive to light.  Neck: Normal range of motion. Neck supple.  Cardiovascular: Normal rate and regular rhythm.   Pulmonary/Chest: Effort normal and breath sounds normal.  Abdominal: Soft. Bowel sounds are normal. He exhibits no distension. There is no tenderness.  Genitourinary: Penis normal.  Genitourinary Comments: +vesicular rash to shaft  Musculoskeletal: Normal range of motion.  Neurological: He is alert and oriented to person, place, and time.  Skin: Skin is warm and dry. Capillary refill takes less than 2 seconds.  Psychiatric: He has a normal mood and affect. His behavior is normal.  Vitals reviewed.    ASSESSMENT & PLAN: [name] was seen today for rash.  Diagnoses and all orders for this visit:  Herpes simplex infection of penis  Other orders -     Discontinue: valACYclovir  (VALTREX) 1000 MG tablet; Take 1 tablet (1,000 mg total) by mouth 2 (two) times daily. -     valACYclovir (VALTREX) 1000 MG tablet; Take 1 tablet (1,000 mg total) by mouth 2 (two) times daily.   Patient Instructions       IF you received an x-ray today, you will receive an invoice from [facility]. Please contact [facility] at [phone] with questions or concerns regarding your invoice.   IF you received labwork today, you will receive an invoice from [place]. Please contact [facility] at [phone] with questions or concerns regarding your invoice.   Our billing staff will not be able to assist you with questions regarding bills from these companies.  You will be contacted with the lab results as soon as they are available. The fastest way to get your results is to activate your My Chart account. Instructions are located on the last page of this paperwork. If you have not heard from [place] regarding the results in 2 weeks, please contact this office.     Genital Herpes Genital herpes is a common sexually transmitted infection (STI) that is caused by a virus. The virus is spread from person to person through sexual contact. Infection can cause itching, blisters, and sores in the genital area or rectal area. This is called an outbreak. It affects both men and women. Genital herpes is particularly concerning for pregnant women because the virus can be passed to the baby during delivery and cause serious problems. Genital herpes is also a concern for people with a weakened defense (immune) system. Symptoms of genital herpes may last several days and then go away. However, the virus remains in your body, so you may have more outbreaks of symptoms in the future. The time between outbreaks varies and can be months or years. CAUSES Genital herpes is caused by a virus called herpes simplex virus (HSV) type 2 or HSV type 1. These viruses are contagious and are most often spread  through sexual contact with an infected person. Sexual contact includes vaginal, anal, and oral sex. RISK FACTORS Risk factors for genital herpes include:  Being sexually active with multiple partners.  Having unprotected sex. SIGNS AND SYMPTOMS Symptoms may include:  Pain and itching in the genital area or rectal area.  Small red bumps that turn into blisters and then turn into sores.  Flu-like symptoms, including:  Fever.  Body aches.  Painful urination.  Vaginal discharge. DIAGNOSIS Genital herpes may be diagnosed by:  Physical exam.  Blood test.  Fluid culture test from an open sore. TREATMENT There is no cure for genital herpes. Oral antiviral medicines may be used to speed up healing and to help prevent the return of symptoms. These medicines can also help to reduce the spread of the virus to sexual partners. HOME CARE INSTRUCTIONS  Keep the affected areas dry and clean.  Take medicines only as directed by your health care provider.  Do not have sexual contact  during active infections. Genital herpes is contagious.  Practice safe sex. Latex condoms and male condoms may help to prevent the spread of the herpes virus.  Avoid rubbing or touching the blisters and sores. If you do touch the blister or sores:  Wash your hands thoroughly.  Do not touch your eyes afterward.  If you become pregnant, tell your health care provider if you have had genital herpes.  Keep all follow-up visits as directed by your health care provider. This is important. PREVENTION  Use condoms. Although anyone can contract genital herpes during sexual contact even with the use of a condom, a condom can provide some protection.  Avoid having multiple sexual partners.  Talk to your sexual partner about any symptoms and past history that either of you may have.  Get tested before you have sex. Ask your partner to do the same.  Recognize the symptoms of genital herpes. Do not have  sexual contact if you notice these symptoms. SEEK MEDICAL CARE IF:  Your symptoms are not improving with medicine.  Your symptoms return.  You have new symptoms.  You have a fever.  You have abdominal pain.  You have redness, swelling, or pain in your eye. MAKE SURE YOU:  Understand these instructions.  Will watch your condition.  Will get help right away if you are not doing well or get worse. This information is not intended to replace advice given to you by your health care provider. Make sure you discuss any questions you have with your health care provider. Document Released: 01/20/2000 Document Revised: 02/12/2014 Document Reviewed: 06/09/2013 Elsevier Interactive Patient Education  2017 Elsevier Inc.      Edwina BarthMiguel Reina Wilton, MD [facility]

## 2016-03-28 NOTE — Patient Instructions (Addendum)
IF you received an x-ray today, you will receive an invoice from [facility]. Please contact [facility] at [phone] with questions or concerns regarding your invoice.   IF you received labwork today, you will receive an invoice from [place]. Please contact [facility] at [phone] with questions or concerns regarding your invoice.   Our billing staff will not be able to assist you with questions regarding bills from these companies.  You will be contacted with the lab results as soon as they are available. The fastest way to get your results is to activate your My Chart account. Instructions are located on the last page of this paperwork. If you have not heard from [place] regarding the results in 2 weeks, please contact this office.     Genital Herpes Genital herpes is a common sexually transmitted infection (STI) that is caused by a virus. The virus is spread from person to person through sexual contact. Infection can cause itching, blisters, and sores in the genital area or rectal area. This is called an outbreak. It affects both men and women. Genital herpes is particularly concerning for pregnant women because the virus can be passed to the baby during delivery and cause serious problems. Genital herpes is also a concern for people with a weakened defense (immune) system. Symptoms of genital herpes may last several days and then go away. However, the virus remains in your body, so you may have more outbreaks of symptoms in the future. The time between outbreaks varies and can be months or years. CAUSES Genital herpes is caused by a virus called herpes simplex virus (HSV) type 2 or HSV type 1. These viruses are contagious and are most often spread through sexual contact with an infected person. Sexual contact includes vaginal, anal, and oral sex. RISK FACTORS Risk factors for genital herpes include:  Being sexually active with multiple partners.  Having unprotected  sex. SIGNS AND SYMPTOMS Symptoms may include:  Pain and itching in the genital area or rectal area.  Small red bumps that turn into blisters and then turn into sores.  Flu-like symptoms, including:  Fever.  Body aches.  Painful urination.  Vaginal discharge. DIAGNOSIS Genital herpes may be diagnosed by:  Physical exam.  Blood test.  Fluid culture test from an open sore. TREATMENT There is no cure for genital herpes. Oral antiviral medicines may be used to speed up healing and to help prevent the return of symptoms. These medicines can also help to reduce the spread of the virus to sexual partners. HOME CARE INSTRUCTIONS  Keep the affected areas dry and clean.  Take medicines only as directed by your health care provider.  Do not have sexual contact during active infections. Genital herpes is contagious.  Practice safe sex. Latex condoms and male condoms may help to prevent the spread of the herpes virus.  Avoid rubbing or touching the blisters and sores. If you do touch the blister or sores:  Wash your hands thoroughly.  Do not touch your eyes afterward.  If you become pregnant, tell your health care provider if you have had genital herpes.  Keep all follow-up visits as directed by your health care provider. This is important. PREVENTION  Use condoms. Although anyone can contract genital herpes during sexual contact even with the use of a condom, a condom can provide some protection.  Avoid having multiple sexual partners.  Talk to your sexual partner about any symptoms and past history that either of you may have.  Get tested before you have sex. Ask your partner to do the same.  Recognize the symptoms of genital herpes. Do not have sexual contact if you notice these symptoms. SEEK MEDICAL CARE IF:  Your symptoms are not improving with medicine.  Your symptoms return.  You have new symptoms.  You have a fever.  You have abdominal pain.  You have  redness, swelling, or pain in your eye. MAKE SURE YOU:  Understand these instructions.  Will watch your condition.  Will get help right away if you are not doing well or get worse. This information is not intended to replace advice given to you by your health care provider. Make sure you discuss any questions you have with your health care provider. Document Released: 01/20/2000 Document Revised: 02/12/2014 Document Reviewed: 06/09/2013 Elsevier Interactive Patient Education  2017 [employer]

## 2016-05-03 ENCOUNTER — Ambulatory Visit (INDEPENDENT_AMBULATORY_CARE_PROVIDER_SITE_OTHER): Payer: 59 | Admitting: Emergency Medicine

## 2016-05-03 ENCOUNTER — Ambulatory Visit (INDEPENDENT_AMBULATORY_CARE_PROVIDER_SITE_OTHER): Payer: 59

## 2016-05-03 VITALS — BP 129/82 | HR 93 | Temp 97.3°F | Resp 18 | Ht 67.0 in | Wt 159.2 lb

## 2016-05-03 DIAGNOSIS — R0789 Other chest pain: Secondary | ICD-10-CM | POA: Diagnosis not present

## 2016-05-03 DIAGNOSIS — M791 Myalgia: Secondary | ICD-10-CM

## 2016-05-03 DIAGNOSIS — M7918 Myalgia, other site: Secondary | ICD-10-CM

## 2016-05-03 MED ORDER — VALACYCLOVIR HCL 1 G PO TABS: 1000.0000 mg | ORAL_TABLET | Freq: Two times a day (BID) | ORAL | 3 refills | Status: DC

## 2016-05-03 MED ORDER — DICLOFENAC SODIUM 75 MG PO TBEC: 75.0000 mg | DELAYED_RELEASE_TABLET | Freq: Two times a day (BID) | ORAL | 0 refills | Status: AC

## 2016-05-03 NOTE — Progress Notes (Signed)
Jermaine Hood 31 y.o.   Chief Complaint  Patient presents with  . Chest Pain    x 2 weeks, unable to get comfortable, pain interrupts sleep    HISTORY OF PRESENT ILLNESS: This is a 31 y.o. male complaining of left sided chest pain x 2 weeks.  HPI   Prior to Admission medications   Medication Sig Start Date End Date Taking? Authorizing Provider  valACYclovir (VALTREX) 1000 MG tablet Take 1 tablet (1,000 mg total) by mouth 2 (two) times daily. 03/28/16  Yes [facility], MD  cyclobenzaprine (FLEXERIL) 10 MG tablet Take 1 tablet (10 mg total) by mouth 2 (two) times daily as needed for muscle spasms. Patient not taking: Reported on 03/28/2016 10/22/15   Roxy Horseman, PA-C  HYDROcodone-acetaminophen (NORCO/VICODIN) 5-325 MG tablet Take 1-2 tablets by mouth every 6 (six) hours as needed. Patient not taking: Reported on 03/28/2016 10/22/15   Roxy Horseman, PA-C    No Known Allergies  Patient Active Problem List   Diagnosis Date Noted  . 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 Liver fibrosis (HCC) 02/08/2015  . Chronic hepatitis C without hepatic coma (HCC) 03/02/2014  . Substance dependence, in remission (HCC) 03/26/2013    No past medical history on file.  Past Surgical History:  Procedure Laterality Date  . APPENDECTOMY      Social History   Social History  . Marital status: Single    Spouse name: N/A  . Number of children: N/A  . Years of education: N/A   Occupational History  . Not on file.   Social History Main Topics  . Smoking status: Current Every Day Smoker    Packs/day: 1.00    Types: Cigarettes    Start date: 02/06/2003  . Smokeless tobacco: Never Used     Comment: cutting back  . Alcohol use No     Comment: rarely  . Drug use: Yes    Types: Marijuana  . Sexual activity: Not on file   Other Topics Concern  . Not on file   Social History Narrative  . No narrative on file    No family history on file.   Review of Systems    Constitutional: Negative.  Negative for chills and fever.  HENT: Negative for congestion, nosebleeds and sore throat.   Eyes: Negative for blurred vision, double vision, discharge and redness.  Respiratory: Negative for cough, hemoptysis, shortness of breath and wheezing.   Cardiovascular: Positive for chest pain. Negative for palpitations, claudication and leg swelling.  Gastrointestinal: Negative for abdominal pain, diarrhea, nausea and vomiting.  Genitourinary: Negative for dysuria and hematuria.  Musculoskeletal: Negative for myalgias and neck pain.  Skin: Negative for rash.  Neurological: Negative for dizziness, sensory change, focal weakness and headaches.  Endo/Heme/Allergies: Negative.   All other systems reviewed and are negative.  Vitals:   05/03/16 1402  BP: 129/82  Pulse: 93  Resp: 18  Temp: 97.3 F (36.3 C)     Physical Exam  Constitutional: He is oriented to person, place, and time. He appears well-developed and well-nourished.  HENT:  Head: Normocephalic and atraumatic.  Nose: Nose normal.  Mouth/Throat: Oropharynx is clear and moist. No oropharyngeal exudate.  Eyes: Conjunctivae and EOM are normal. Pupils are equal, round, and reactive to light.  Neck: Normal range of motion. Neck supple. No JVD present. No thyromegaly present.  Cardiovascular: Normal rate, regular rhythm, normal heart sounds and intact distal pulses.   Pulmonary/Chest: Effort normal and breath sounds normal.  He exhibits tenderness.  Abdominal: Soft. Bowel sounds are normal. He exhibits no distension. There is no tenderness.  Musculoskeletal: Normal range of motion.  Lymphadenopathy:    He has no cervical adenopathy.  Neurological: He is alert and oriented to person, place, and time. No sensory deficit. He exhibits normal muscle tone.  Skin: Capillary refill takes less than 2 seconds.  Psychiatric: He has a normal mood and affect. His behavior is normal.  Vitals reviewed.  EKG: NSR with no  acute ischemic changes. CXR reviewed by me: NAD  ASSESSMENT & PLAN: Jermaine Hood was seen today for chest pain.  Diagnoses and all orders for this visit:  Atypical chest pain -     EKG 12-Lead -     DG Chest 2 View; Future  Musculoskeletal pain  Other orders -     diclofenac (VOLTAREN) 75 MG EC tablet; Take 1 tablet (75 mg total) by mouth 2 (two) times daily. -     valACYclovir (VALTREX) 1000 MG tablet; Take 1 tablet (1,000 mg total) by mouth 2 (two) times daily.    Patient Instructions       IF you received an x-ray today, you will receive an [facility]. Please contact [facility] at [phone] with questions or concerns regarding your invoice.   IF you received labwork today, you will receive an [facility] from [facility]. Please contact [facility] at [phone] with questions or concerns regarding your invoice.   Our billing staff will not be able to assist you with questions regarding bills from these companies.  You will be contacted with the lab results as soon as they are available. The fastest way to get your results is to activate your My Chart account. Instructions are located on the last page of this paperwork. If you have not heard from us regarding the results in 2 weeks, please contact this office.     Nonspecific Chest Pain Chest pain can be caused by many different conditions. There is a chance that your pain could be related to something serious, such as a heart attack or a blood clot in your lungs. Chest pain can also be caused by conditions that are not life-threatening. If you have chest pain, it is very important to follow up with your doctor. Follow these instructions at home: Medicines   If you were prescribed an antibiotic medicine, take it as told by your doctor. Do not stop taking the antibiotic even if you start to feel better.  Take over-the-counter and prescription medicines only as told by your doctor. Lifestyle   Do  not use any products that contain nicotine or tobacco, such as cigarettes and e-cigarettes. If you need help quitting, ask your doctor.  Do not drink alcohol.  Make lifestyle changes as told by your doctor. These may include:  Getting regular exercise. Ask your doctor for some activities that are safe for you.  Eating a heart-healthy diet. A diet specialist (dietitian) can help you to learn healthy eating options.  Staying at a healthy weight.  Managing diabetes, if needed.  Lowering your stress, as with deep breathing or spending time in nature. General instructions   Avoid any activities that make you feel chest pain.  If your chest pain is because of heartburn:  Raise (elevate) the head of your bed about 6 inches (15 cm). You can do this by putting blocks under the bed legs at the head of the bed.  Do not sleep with extra pillows under your head.  That does not help heartburn.  Keep all follow-up visits as told by your doctor. This is important. This includes any further testing if your chest pain does not go away. Contact a doctor if:  Your chest pain does not go away.  You have a rash with blisters on your chest.  You have a fever.  You have chills. Get help right away if:  Your chest pain is worse.  You have a cough that gets worse, or you cough up blood.  You have very bad (severe) pain in your belly (abdomen).  You are very weak.  You pass out (faint).  You have either of these for no clear reason:  Sudden chest discomfort.  Sudden discomfort in your arms, back, neck, or jaw.  You have shortness of breath at any time.  You suddenly start to sweat, or your skin gets clammy.  You feel sick to your stomach (nauseous).  You throw up (vomit).  You suddenly feel light-headed or dizzy.  Your heart starts to beat fast, or it feels like it is skipping beats. These symptoms may be an emergency. Do not wait to see if the symptoms will go away. Get medical  help right away. Call your local emergency services (911 in the [place] Do not drive yourself to the hospital. This information is not intended to replace advice given to you by your health care provider. Make sure you discuss any questions you have with your health care provider. Document Released: 07/11/2007 Document Revised: 10/17/2015 Document Reviewed: 10/17/2015 Elsevier Interactive Patient Education  2017 Elsevier Inc.      Edwina Barth, MD [facility]

## 2016-05-03 NOTE — Patient Instructions (Addendum)
   IF you received an x-ray today, you will receive an invoice from [facility]. Please contact [facility] at [phone] with questions or concerns regarding your invoice.   IF you received labwork today, you will receive an invoice from [facility]. Please contact [facility] at [phone] with questions or concerns regarding your invoice.   Our billing staff will not be able to assist you with questions regarding bills from these companies.  You will be contacted with the lab results as soon as they are available. The fastest way to get your results is to activate your My Chart account. Instructions are located on the last page of this paperwork. If you have not heard from us regarding the results in 2 weeks, please contact this office.     Nonspecific Chest Pain Chest pain can be caused by many different conditions. There is a chance that your pain could be related to something serious, such as a heart attack or a blood clot in your lungs. Chest pain can also be caused by conditions that are not life-threatening. If you have chest pain, it is very important to follow up with your doctor. Follow these instructions at home: Medicines  If you were prescribed an antibiotic medicine, take it as told by your doctor. Do not stop taking the antibiotic even if you start to feel better.  Take over-the-counter and prescription medicines only as told by your doctor. Lifestyle  Do not use any products that contain nicotine or tobacco, such as cigarettes and e-cigarettes. If you need help quitting, ask your doctor.  Do not drink alcohol.  Make lifestyle changes as told by your doctor. These may include: ? Getting regular exercise. Ask your doctor for some activities that are safe for you. ? Eating a heart-healthy diet. A diet specialist (dietitian) can help you to learn healthy eating options. ? Staying at a healthy weight. ? Managing diabetes, if needed. ? Lowering your  stress, as with deep breathing or spending time in nature. General instructions  Avoid any activities that make you feel chest pain.  If your chest pain is because of heartburn: ? Raise (elevate) the head of your bed about 6 inches (15 cm). You can do this by putting blocks under the bed legs at the head of the bed. ? Do not sleep with extra pillows under your head. That does not help heartburn.  Keep all follow-up visits as told by your doctor. This is important. This includes any further testing if your chest pain does not go away. Contact a doctor if:  Your chest pain does not go away.  You have a rash with blisters on your chest.  You have a fever.  You have chills. Get help right away if:  Your chest pain is worse.  You have a cough that gets worse, or you cough up blood.  You have very bad (severe) pain in your belly (abdomen).  You are very weak.  You pass out (faint).  You have either of these for no clear reason: ? Sudden chest discomfort. ? Sudden discomfort in your arms, back, neck, or jaw.  You have shortness of breath at any time.  You suddenly start to sweat, or your skin gets clammy.  You feel sick to your stomach (nauseous).  You throw up (vomit).  You suddenly feel light-headed or dizzy.  Your heart starts to beat fast, or it feels like it is skipping beats. These symptoms may be an emergency. Do not wait   to see if the symptoms will go away. Get medical help right away. Call your local emergency services (911 in the [place] Do not drive yourself to the hospital. This information is not intended to replace advice given to you by your health care provider. Make sure you discuss any questions you have with your health care provider. Document Released: 07/11/2007 Document Revised: 10/17/2015 Document Reviewed: 10/17/2015 Elsevier Interactive Patient Education  2017 [employer]vier Inc.  

## 2016-05-30 ENCOUNTER — Emergency Department (HOSPITAL_COMMUNITY)
Admission: EM | Admit: 2016-05-30 | Discharge: 2016-05-31 | Disposition: A | Discharge status: Home / Self Care | Payer: 59 | Attending: Emergency Medicine | Admitting: Emergency Medicine

## 2016-05-30 ENCOUNTER — Ambulatory Visit (INDEPENDENT_AMBULATORY_CARE_PROVIDER_SITE_OTHER): Payer: 59 | Admitting: Urgent Care

## 2016-05-30 ENCOUNTER — Encounter (HOSPITAL_COMMUNITY): Payer: Self-pay | Admitting: Vascular Surgery

## 2016-05-30 VITALS — BP 127/72 | HR 96 | Temp 98.7°F | Resp 17 | Ht 68.5 in | Wt 154.0 lb

## 2016-05-30 DIAGNOSIS — F17210 Nicotine dependence, cigarettes, uncomplicated: Secondary | ICD-10-CM | POA: Insufficient documentation

## 2016-05-30 DIAGNOSIS — Z23 Encounter for immunization: Secondary | ICD-10-CM | POA: Diagnosis not present

## 2016-05-30 DIAGNOSIS — F1990 Other psychoactive substance use, unspecified, uncomplicated: Secondary | ICD-10-CM

## 2016-05-30 DIAGNOSIS — M25521 Pain in right elbow: Secondary | ICD-10-CM | POA: Diagnosis not present

## 2016-05-30 DIAGNOSIS — M25621 Stiffness of right elbow, not elsewhere classified: Secondary | ICD-10-CM | POA: Diagnosis not present

## 2016-05-30 DIAGNOSIS — F1910 Other psychoactive substance abuse, uncomplicated: Secondary | ICD-10-CM | POA: Insufficient documentation

## 2016-05-30 DIAGNOSIS — L02413 Cutaneous abscess of right upper limb: Secondary | ICD-10-CM | POA: Insufficient documentation

## 2016-05-30 DIAGNOSIS — L0291 Cutaneous abscess, unspecified: Secondary | ICD-10-CM

## 2016-05-30 MED ORDER — TETANUS-DIPHTH-ACELL PERTUSSIS 5-2.5-18.5 LF-MCG/0.5 IM SUSP
0.5000 mL | Freq: Once | INTRAMUSCULAR | Status: AC
  Administered 2016-05-30: 0.5 mL via INTRAMUSCULAR
  Filled 2016-05-30: qty 0.5

## 2016-05-30 MED ORDER — LIDOCAINE HCL (PF) 1 % IJ SOLN
5.0000 mL | Freq: Once | INTRAMUSCULAR | Status: AC
  Administered 2016-05-30: 5 mL
  Filled 2016-05-30: qty 5

## 2016-05-30 NOTE — ED Provider Notes (Signed)
[facility]-EMERGENCY DEPT Provider Note   CSN: [id number] Arrival date & time: 05/30/16  2139   By signing my name below, I, Clovis Pu, attest that this documentation has been prepared under the direction and in the presence of  Kerrie Buffalo, NP. Electronically Signed: Clovis Pu, ED Scribe. 05/30/16. 10:34 PM.   History   Chief Complaint Chief Complaint  Patient presents with  . Abscess   HPI Comments:  Jermaine Hood is a 31 y.o. male who presents to the Emergency Department complaining of acute onset, gradually worsening area of swelling and redness to his right upper extremity x 1.5-2 weeks. He reports associated pain and notes his pain radiates up to his right axilla. Pt also reports subjective fever and chills. 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 No alleviating factors noted. Pt denies nausea, vomiting or any other associated symptoms. No other complaints noted at this time.   The history is provided by the patient. No language interpreter was used.  Abscess  Location:  Shoulder/arm Shoulder/arm abscess location:  R upper arm Size:  5 cm Abscess quality: fluctuance, redness and warmth   Red streaking: no   Progression:  Worsening Chronicity:  New Context: injected drug use   Relieved by:  Nothing Worsened by:  Nothing Ineffective treatments:  None tried Associated symptoms: no fever, no headaches, no nausea and no vomiting   Risk factors: no prior abscess     History reviewed. No pertinent past medical history.  Patient Active Problem List   Diagnosis Date Noted  . Atypical chest pain 05/03/2016  . Musculoskeletal pain 05/03/2016  . 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 Liver fibrosis 02/08/2015  . Chronic hepatitis C without hepatic coma (HCC) 03/02/2014  . Substance dependence, in remission (HCC) 03/26/2013    Past Surgical History:  Procedure Laterality Date  . APPENDECTOMY      Home Medications    Prior to  Admission medications   Medication Sig Start Date End Date Taking? Authorizing Provider  doxycycline (VIBRAMYCIN) 100 MG capsule Take 1 capsule (100 mg total) by mouth 2 (two) times daily. 05/31/16   Alyzah Pelly Orlene Och, NP    Family History No family history on file.  Social History Social History  Substance Use Topics  . Smoking status: Current Every Day Smoker    Packs/day: 1.00    Years: 12.00    Types: Cigarettes    Start date: 02/06/2003  . Smokeless tobacco: Never Used     Comment: cutting back  . Alcohol use No     Comment: rarely     Allergies   Patient has no known allergies.   Review of Systems Review of Systems  Constitutional: Positive for chills. Negative for fever.  Respiratory: Negative for shortness of breath.   Cardiovascular: Negative for chest pain.  Gastrointestinal: Negative for nausea and vomiting.  Musculoskeletal: Positive for arthralgias and myalgias.  Skin: Positive for color change and wound.  Neurological: Negative for headaches.  Psychiatric/Behavioral: The patient is not nervous/anxious.      Physical Exam Updated Vital Signs BP (!) 101/54   Pulse 88   Temp 98 F (36.7 C) (Oral)   Resp 16   SpO2 97%   Physical Exam  Constitutional: He is oriented to person, place, and time. He appears well-developed and well-nourished. No distress.  HENT:  Head: Normocephalic and atraumatic.  Eyes: Conjunctivae are normal.  Neck: Neck supple.  Cardiovascular: Normal rate.  Pulses:      Radial pulses are 2+ on the right side, and 2+ on the left side.  Pulmonary/Chest: Effort normal.  Musculoskeletal: Normal range of motion.  Tenderness to right axilla. Pain radiates from the abscess up to the axilla.   Neurological: He is alert and oriented to person, place, and time.  Grips equal  Skin: Skin is warm and dry.  5 cm raised, fluctuant, erythematous area to the right upper arm just above the [employer]. Increased warmth noted  Psychiatric: He has a normal  mood and affect.  Nursing note and vitals reviewed.    ED Treatments / Results  DIAGNOSTIC STUDIES:  Oxygen Saturation is 97% on RA, normal by my interpretation.    COORDINATION OF CARE:  10:32 PM Discussed treatment plan with pt at bedside and pt agreed to plan.  Labs (all labs ordered are listed, but only abnormal results are displayed) Labs Reviewed - No data to display   Radiology Dg Elbow Complete Right  Result Date: 05/31/2016 CLINICAL DATA:  Soft tissue swelling at the anteromedial right elbow. Concern for abscess. Further evaluation requested. EXAM: RIGHT ELBOW - COMPLETE 3+ VIEW COMPARISON:  None. FINDINGS: No radiopaque foreign bodies are seen. Soft tissue swelling is noted along the volar aspect of the right lower arm. No osseous erosion is seen. There is no evidence of fracture or dislocation. The visualized joint spaces are preserved. No significant joint effusion is identified. IMPRESSION: No radiopaque foreign bodies seen. No osseous erosions seen. Abscess is not well assessed on radiograph. Electronically Signed   By: Roanna Raider M.D.   On: 05/31/2016 00:51    Procedures .Marland KitchenIncision and Drainage Date/Time: 05/30/2016 11:14 PM Performed by: Janne Napoleon Authorized by: Janne Napoleon   Consent:    Consent obtained:  Verbal   Consent given by:  Patient   Risks discussed:  Pain, infection and bleeding Location:    Type:  Abscess   Size:  5 cm   Location:  Upper extremity   Upper extremity location:  Arm   Arm location:  R upper arm Pre-procedure details:    Skin preparation:  Betadine Anesthesia (see MAR for exact dosages):    Anesthesia method:  Local infiltration   Local anesthetic:  Lidocaine 1% w/o epi Procedure type:    Complexity:  Complex Procedure details:    Needle aspiration: no     Incision types:  Single straight   Incision depth:  Dermal   Scalpel blade:  11   Wound management:  Probed and deloculated and irrigated with saline    Drainage:  Purulent and bloody   Drainage amount:  Copious   Packing materials:  1/4 in iodoform gauze Post-procedure details:    Patient tolerance of procedure:  Tolerated well, no immediate complications Comments:     Pt tolerated the procedure with a near syncopal episode which he recovered from quickly. EMT entered room to monitor blood pressure, placed cool wet cloth on forehead and put pt in trendelenburg position.       (including critical care time)  Medications Ordered in ED Medications  lidocaine (PF) (XYLOCAINE) 1 % injection 5 mL (5 mLs Infiltration Given by Other 05/30/16 2247)  Tdap (BOOSTRIX) injection 0.5 mL (0.5 mLs Intramuscular Given 05/30/16 2248)  ondansetron (ZOFRAN-ODT) disintegrating tablet 4 mg (4 mg Oral Given 05/31/16 0008)     Initial Impression / Assessment and Plan / ED Course  I have reviewed the triage vital signs and the nursing notes.  Pertinent labs & imaging results that were available during my care of the patient were reviewed by me and considered in my medical decision making (see chart for details).     Patient with skin abscess. Incision and drainage performed in the ED today.  Abscess was large enough to warrant packing. Wound recheck in 2 days. Supportive care and return precautions discussed.  Pt sent home with Doxycycline. The patient appears reasonably screened and/or stabilized for discharge and I doubt any other emergent medical condition requiring further screening, evaluation, or treatment in the ED prior to discharge.   Final Clinical Impressions(s) / ED Diagnoses   Final diagnoses:  Abscess  IV drug user    New Prescriptions New Prescriptions   DOXYCYCLINE (VIBRAMYCIN) 100 MG CAPSULE    Take 1 capsule (100 mg total) by mouth 2 (two) times daily.   I personally performed the services described in this documentation, which was scribed in my presence. The recorded information has been reviewed and is accurate.     [address] [place],  [place] 05/31/16 0149    Azalia Bilis, MD 05/31/16 0630

## 2016-05-30 NOTE — Progress Notes (Signed)
  MRN: [id number] DOB: Mar 15, 1985  Subjective:   Jermaine Hood is a 31 y.o. male presenting for chief complaint of lump on arm  Reports 2 week history of worsening painful lump on right upper arm. Associated symptoms include subjective fever, chills, decreased range of motion, inability to extend forearm. Patient is frequent IV drug user, last use was 24 hours ago.   [name] is not currently taking any medications. Also has No Known Allergies. Jermaine Hood  has no past medical history on file. Also  has a past surgical history that includes Appendectomy.  Objective:   Vitals: BP 127/72   Pulse 96   Temp 98.7 F (37.1 C) (Oral)   Resp 17   Ht 5' 8.5" (1.74 m)   Wt 154 lb (69.9 kg)   SpO2 97%   BMI 23.08 kg/m   Physical Exam  Constitutional: He is oriented to person, place, and time. He appears well-developed and well-nourished.  Cardiovascular: Normal rate.   Pulmonary/Chest: Effort normal.  Musculoskeletal:       Right elbow: He exhibits decreased range of motion (flexion and extension). No tenderness found.       Right upper arm: He exhibits tenderness (exquisite tenderness over erythematous fluctant mass ~3cm in diameter at distal portion of bicep extending into flexor surface of right elbow) and swelling. He exhibits no bony tenderness, no deformity and no laceration.  Neurological: He is alert and oriented to person, place, and time.   Assessment and Plan :   This case was precepted with Dr. Gwendolyn Grant.  1. Abscess of right arm 2. Decreased range of motion of right elbow 3. Arthralgia of right upper arm 4. IV drug user - Patient's symptoms consistent with severe abscess. Joint space involvement needs to be determined with imaging. Patient will need surgical drainage and consideration for IV antibiotics given that he is an IV drug user. High risk patient to develop sepsis. Patient verbalizes understanding and will report to [facility] immediately. Press photographer at [facility] ER  notified.  Wallis Bamberg, PA-C Primary Care at [facility] [phone] 05/30/2016  4:42 PM

## 2016-05-30 NOTE — ED Notes (Signed)
See EDP secondary assessment.  

## 2016-05-30 NOTE — Patient Instructions (Addendum)
Please report to [facility] ER as you need emergency evaluation, surgical drainage, imaging and antibiotics for your abscess.   Skin Abscess A skin abscess is an infected area on or under your skin that contains a collection of pus and other material. An abscess may also be called a furuncle, carbuncle, or boil. An abscess can occur in or on almost any part of your body. Some abscesses break open (rupture) on their own. Most continue to get worse unless they are treated. The infection can spread deeper into the body and eventually into your blood, which can make you feel ill. Treatment usually involves draining the abscess. What are the causes? An abscess occurs when germs, often bacteria, pass through your skin and cause an infection. This may be caused by:  A scrape or cut on your skin.  A puncture wound through your skin, including a needle injection.  Blocked oil or sweat glands.  Blocked and infected hair follicles.  A cyst that forms beneath your skin (sebaceous cyst) and becomes infected. What increases the risk? This condition is more likely to develop in people who:  Have a weak body defense system (immune system).  Have diabetes.  Have dry and irritated skin.  Get frequent injections or use illegal IV drugs.  Have a foreign body in a wound, such as a splinter.  Have problems with their lymph system or veins. What are the signs or symptoms? An abscess may start as a painful, firm bump under the skin. Over time, the abscess may get larger or become softer. Pus may appear at the top of the abscess, causing pressure and pain. It may eventually break through the skin and drain. Other symptoms include:  Redness.  Warmth.  Swelling.  Tenderness.  A sore on the skin. How is this diagnosed? This condition is diagnosed based on your medical history and a physical exam. A sample of pus may be taken from the abscess to find out what is causing the infection and what  antibiotics can be used to treat it. You also may have:  Blood tests to look for signs of infection or spread of an infection to your blood.  Imaging studies such as ultrasound, CT scan, or MRI if the abscess is deep. How is this treated? Small abscesses that drain on their own may not need treatment. Treatment for an abscess that does not rupture on its own may include:  Warm compresses applied to the area several times per day.  Incision and drainage. Your health care provider will make an incision to open the abscess and will remove pus and any foreign body or dead tissue. The incision area may be packed with gauze to keep it open for a few days while it heals.  Antibiotic medicines to treat infection. For a severe abscess, you may first get antibiotics through an IV and then change to oral antibiotics. Follow these instructions at home: Abscess Care   If you have an abscess that has not drained, place a warm, clean, wet washcloth over the abscess several times a day. Do this as told by your health care provider.  Follow instructions from your health care provider about how to take care of your abscess. Make sure you:  Cover the abscess with a bandage (dressing).  Change your dressing or gauze as told by your health care provider.  Wash your hands with soap and water before you change the dressing or gauze. If soap and water are not available, use  hand sanitizer.  Check your abscess every day for signs of a worsening infection. Check for:  More redness, swelling, or pain.  More fluid or blood.  Warmth.  More pus or a bad smell. Medicines   Take over-the-counter and prescription medicines only as told by your health care provider.  If you were prescribed an antibiotic medicine, take it as told by your health care provider. Do not stop taking the antibiotic even if you start to feel better. General instructions   To avoid spreading the infection:  Do not share personal  care items, towels, or hot tubs with others.  Avoid making skin contact with other people.  Keep all follow-up visits as told by your health care provider. This is important. Contact a health care provider if:  You have more redness, swelling, or pain around your abscess.  You have more fluid or blood coming from your abscess.  Your abscess feels warm to the touch.  You have more pus or a bad smell coming from your abscess.  You have a fever.  You have muscle aches.  You have chills or a general ill feeling. Get help right away if:  You have severe pain.  You see red streaks on your skin spreading away from the abscess. This information is not intended to replace advice given to you by your health care provider. Make sure you discuss any questions you have with your health care provider. Document Released: 11/01/2004 Document Revised: 09/18/2015 Document Reviewed: 12/01/2014 Elsevier Interactive Patient Education  2017 [employer].

## 2016-05-30 NOTE — ED Triage Notes (Signed)
Pt reports to the ED for eval of abscess to right upper arm x 2 weeks. States that it has been getting progressively worse, especially over the past 2-3 days. Denies any hx of previous abscesses. It has not been draining. Has not tried any home tx for the abscess. Denies any systemic symptoms.

## 2016-05-31 ENCOUNTER — Emergency Department (HOSPITAL_COMMUNITY): Payer: 59

## 2016-05-31 MED ORDER — ONDANSETRON 4 MG PO TBDP
4.0000 mg | ORAL_TABLET | Freq: Once | ORAL | Status: AC
  Administered 2016-05-31: 4 mg via ORAL
  Filled 2016-05-31: qty 1

## 2016-05-31 MED ORDER — DOXYCYCLINE HYCLATE 100 MG PO CAPS: 100.0000 mg | ORAL_CAPSULE | Freq: Two times a day (BID) | ORAL | 0 refills | Status: DC

## 2016-05-31 NOTE — Discharge Instructions (Signed)
Follow up with your doctor or return here in 2 day for wound check and packing removal. Take the antibiotics as directed. Return sooner than 2 days if you have fever, red streaking up your arm or other problems.

## 2016-05-31 NOTE — ED Notes (Signed)
Patient transported to X-ray 

## 2016-06-26 ENCOUNTER — Ambulatory Visit (HOSPITAL_COMMUNITY)
Admission: RE | Admit: 2016-06-26 | Discharge: 2016-06-26 | Disposition: A | Discharge status: Home / Self Care | Payer: 59 | Source: Ambulatory Visit | Attending: Urgent Care | Admitting: Urgent Care

## 2016-06-26 ENCOUNTER — Ambulatory Visit (INDEPENDENT_AMBULATORY_CARE_PROVIDER_SITE_OTHER): Payer: 59 | Admitting: Urgent Care

## 2016-06-26 ENCOUNTER — Ambulatory Visit (HOSPITAL_BASED_OUTPATIENT_CLINIC_OR_DEPARTMENT_OTHER): Payer: No Typology Code available for payment source

## 2016-06-26 ENCOUNTER — Encounter (HOSPITAL_COMMUNITY): Payer: Self-pay

## 2016-06-26 ENCOUNTER — Encounter: Payer: Self-pay | Admitting: Urgent Care

## 2016-06-26 VITALS — BP 131/73 | HR 66 | Temp 98.2°F | Resp 16 | Ht 67.0 in | Wt 150.0 lb

## 2016-06-26 DIAGNOSIS — R0789 Other chest pain: Secondary | ICD-10-CM | POA: Diagnosis not present

## 2016-06-26 DIAGNOSIS — F1990 Other psychoactive substance use, unspecified, uncomplicated: Secondary | ICD-10-CM | POA: Diagnosis not present

## 2016-06-26 DIAGNOSIS — F17200 Nicotine dependence, unspecified, uncomplicated: Secondary | ICD-10-CM

## 2016-06-26 DIAGNOSIS — R222 Localized swelling, mass and lump, trunk: Secondary | ICD-10-CM

## 2016-06-26 MED ORDER — IOPAMIDOL (ISOVUE-300) INJECTION 61%
INTRAVENOUS | Status: AC
  Administered 2016-06-26: 75 mL
  Filled 2016-06-26: qty 75

## 2016-06-26 NOTE — Addendum Note (Signed)
Addended by: Isaac BlissGALLOWAY, Makaylynn Bonillas J on: 06/26/2016 11:24 AM   Modules accepted: Orders

## 2016-06-26 NOTE — Addendum Note (Signed)
Addended by: Isaac BlissGALLOWAY, Michelangelo Rindfleisch J on: 06/26/2016 10:33 AM   Modules accepted: Orders

## 2016-06-26 NOTE — Progress Notes (Signed)
  MRN: [id number] DOB: 05/09/1985  Subjective:   Jermaine Hood is a 31 y.o. male presenting for chief complaint of knot on chest (knot on left upper part side of chest for past couple of  weeks; patient states it hurts)  Reports ~2 month history of intermittent swelling over his left chest. Last OV for this was on 05/03/2016, was prescribed diclofenac for chest wall pain. This helped some but his mass returned in the past 2 weeks and is painful, cannot sleep on his left side. Denies fever, redness, drainage of pus or bleeding. Smokes 1ppd. A chest x-ray from 05/03/2016 was completely normal.  Jermaine Hood is not currently taking any medications. Also has No Known Allergies. Jermaine Hood  has a past medical history of IV drug user. Also  has a past surgical history that includes Appendectomy.  Objective:   Vitals: BP 131/73   Pulse 66   Temp 98.2 F (36.8 C) (Oral)   Resp 16   Ht 5\' 7"  (1.702 m)   Wt 150 lb (68 kg)   SpO2 99%   BMI 23.49 kg/m   Physical Exam  Constitutional: He is oriented to person, place, and time. He appears well-developed and well-nourished.  Cardiovascular: Normal rate, regular rhythm and intact distal pulses.  Exam reveals no gallop and no friction rub.   No murmur heard. Pulmonary/Chest: Effort normal. No respiratory distress. He has no wheezes. He has no rales. He exhibits mass (fixed but slightly compressible mass over area depicted with associated tenderness but no erythema, fluctuance or induration).    Neurological: He is alert and oriented to person, place, and time.   Assessment and Plan :   1. Mass of chest wall, left 2. Chest wall pain 3. IV drug user 4. Tobacco use disorder - Will pursue U/S of chest to rule out lipoma versus cyst versus abscess versus malignancy. Stat U/S pending.  Wallis BambergMario Cleta Heatley, PA-C Primary Care at [facility] [phone] 06/26/2016  9:36 AM

## 2016-06-26 NOTE — Patient Instructions (Addendum)
Please report to for your scheduled Chest Ultrasound at    . The address  Please leave now and go straight to the facility. Remain there until you are given further instructions.      IF you received an x-ray today, you will [facility] an [facility]. [facility] at [phone] with questions or concerns regarding your invoice.   IF you received labwork today, you will receive an invoice from [facility]. Please contact [facility] at [phone] with questions or concerns regarding your invoice.   Our billing staff will not be able to assist you with questions regarding bills from these companies.  You will be contacted with the lab results as soon as they are available. The fastest way to get your results is to activate your My Chart account. Instructions are located on the last page of this paperwork. If you have not heard from us regarding the results in 2 weeks, please contact this office.

## 2016-06-28 ENCOUNTER — Telehealth: Payer: Self-pay | Admitting: Urgent Care

## 2016-06-28 DIAGNOSIS — R222 Localized swelling, mass and lump, trunk: Secondary | ICD-10-CM

## 2016-06-28 NOTE — Telephone Encounter (Signed)
Reported results to patient. He would like to pursue an MRI. I will place order and we'll be in touch with the patient regarding the findings.

## 2016-06-28 NOTE — Telephone Encounter (Signed)
Pt is calling to receive his CT results.  Please advise [phone]

## 2016-07-06 ENCOUNTER — Telehealth: Payer: Self-pay

## 2016-07-06 ENCOUNTER — Ambulatory Visit: Payer: 59 | Admitting: Urgent Care

## 2016-07-06 NOTE — Telephone Encounter (Signed)
Call placed to patient regarding missed appointment today. No answer on patient phone, left message for patient to return call to office. [facility]

## 2016-07-11 ENCOUNTER — Inpatient Hospital Stay: Admission: RE | Admit: 2016-07-11 | Payer: 59 | Source: Ambulatory Visit

## 2016-07-17 ENCOUNTER — Other Ambulatory Visit: Payer: Self-pay | Admitting: Urgent Care

## 2016-07-23 ENCOUNTER — Ambulatory Visit
Admission: RE | Admit: 2016-07-23 | Discharge: 2016-07-23 | Disposition: A | Discharge status: Home / Self Care | Payer: 59 | Source: Ambulatory Visit | Attending: Urgent Care | Admitting: Urgent Care

## 2016-07-23 DIAGNOSIS — R222 Localized swelling, mass and lump, trunk: Secondary | ICD-10-CM

## 2016-07-23 MED ORDER — GADOBENATE DIMEGLUMINE 529 MG/ML IV SOLN
15.0000 mL | Freq: Once | INTRAVENOUS | Status: AC | PRN
  Administered 2016-07-23: 15 mL via INTRAVENOUS

## 2016-10-16 ENCOUNTER — Encounter: Payer: Self-pay | Admitting: Emergency Medicine

## 2016-10-16 ENCOUNTER — Ambulatory Visit (INDEPENDENT_AMBULATORY_CARE_PROVIDER_SITE_OTHER): Payer: 59 | Admitting: Emergency Medicine

## 2016-10-16 VITALS — BP 124/78 | HR 73 | Temp 98.2°F | Resp 17 | Ht 67.0 in | Wt 150.0 lb

## 2016-10-16 DIAGNOSIS — F32A Depression, unspecified: Secondary | ICD-10-CM

## 2016-10-16 DIAGNOSIS — F329 Major depressive disorder, single episode, unspecified: Secondary | ICD-10-CM | POA: Insufficient documentation

## 2016-10-16 DIAGNOSIS — Z760 Encounter for issue of repeat prescription: Secondary | ICD-10-CM | POA: Diagnosis not present

## 2016-10-16 DIAGNOSIS — Z8619 Personal history of other infectious and parasitic diseases: Secondary | ICD-10-CM | POA: Insufficient documentation

## 2016-10-16 MED ORDER — FLUOXETINE HCL 20 MG PO TABS: 20.0000 mg | ORAL_TABLET | Freq: Every day | ORAL | 3 refills | Status: DC

## 2016-10-16 MED ORDER — VALACYCLOVIR HCL 1 G PO TABS: 1000.0000 mg | ORAL_TABLET | Freq: Two times a day (BID) | ORAL | 3 refills | Status: DC

## 2016-10-16 NOTE — Progress Notes (Signed)
Jermaine Hood 31 y.o.   Chief Complaint  Patient presents with  . Medication Refill    valtrex, prozac     HISTORY OF PRESENT ILLNESS: This is a 31 y.o. male needs medication refills x recurrent Herpes infections and Depression. On Valtrex and Prozac. Gets Herpes outbreaks every 3-4 months and also sees Psychiatrist regularly. No complaints or medical concerns; chronic smoker.  HPI   Prior to Admission medications   Medication Sig Start Date End Date Taking? Authorizing Provider  FLUoxetine (PROZAC) 20 MG tablet Take 1 tablet (20 mg total) by mouth daily. 10/16/16   Georgina Quint, MD  valACYclovir (VALTREX) 1000 MG tablet Take 1 tablet (1,000 mg total) by mouth 2 (two) times daily. 10/16/16   Georgina Quint, MD    No Known Allergies  Patient Active Problem List   Diagnosis Date Noted  . Atypical chest pain 05/03/2016  . Musculoskeletal pain 05/03/2016  . 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 Liver fibrosis 02/08/2015  . Chronic hepatitis C without hepatic coma (HCC) 03/02/2014  . Substance dependence, in remission (HCC) 03/26/2013    Past Medical History:  Diagnosis Date  . IV drug user     Past Surgical History:  Procedure Laterality Date  . APPENDECTOMY      Social History   Social History  . Marital status: Single    Spouse name: N/A  . Number of children: N/A  . Years of education: N/A   Occupational History  . Not on file.   Social History Main Topics  . Smoking status: Current Every Day Smoker    Packs/day: 1.00    Years: 12.00    Types: Cigarettes    Start date: 02/06/2003  . Smokeless tobacco: Never Used     Comment: cutting back  . Alcohol use No     Comment: rarely  . Drug use: Yes    Types: Marijuana  . Sexual activity: Not on file   Other Topics Concern  . Not on file   Social History Narrative  . No narrative on file    No family history on file.   Review of Systems  Constitutional: Negative.  Negative  for chills, fever and malaise/fatigue.  HENT: Negative.  Negative for congestion, nosebleeds and sore throat.   Eyes: Negative.  Negative for blurred vision and double vision.  Respiratory: Negative.  Negative for shortness of breath.   Cardiovascular: Negative.  Negative for chest pain, palpitations and leg swelling.  Gastrointestinal: Negative.  Negative for abdominal pain, diarrhea, nausea and vomiting.  Genitourinary: Negative.  Negative for dysuria and hematuria.  Musculoskeletal: Negative.  Negative for myalgias and neck pain.  Skin: Negative.  Negative for rash.  Neurological: Negative.  Negative for dizziness, sensory change, focal weakness and headaches.  Endo/Heme/Allergies: Negative.   All other systems reviewed and are negative.   Vitals:   10/16/16 1029  BP: 124/78  Pulse: 73  Resp: 17  Temp: 98.2 F (36.8 C)  SpO2: 98%    Physical Exam  Constitutional: He is oriented to person, place, and time. He appears well-developed and well-nourished.  HENT:  Head: Normocephalic and atraumatic.  Nose: Nose normal.  Mouth/Throat: Oropharynx is clear and moist.  Eyes: Pupils are equal, round, and reactive to light. Conjunctivae and EOM are normal.  Neck: Normal range of motion. Neck supple. No JVD present. No thyromegaly present.  Cardiovascular: Normal rate, regular rhythm, normal heart sounds and intact distal pulses.   PulmonarAlden Hipp/Chest: Effort  normal and breath sounds normal.  Abdominal: Soft. Bowel sounds are normal. He exhibits no distension. There is no tenderness.  Musculoskeletal: Normal range of motion.  Lymphadenopathy:    He has no cervical adenopathy.  Neurological: He is alert and oriented to person, place, and time. No sensory deficit. He exhibits normal muscle tone.  Skin: Skin is warm and dry. Capillary refill takes less than 2 seconds. No rash noted.  Psychiatric: He has a normal mood and affect. His behavior is normal.  Vitals reviewed.    ASSESSMENT &  PLAN: Rosalyn GessGrayson was seen today for medication refill.  Diagnoses and all orders for this visit:  Depression, unspecified depression type  History of herpes simplex infection  Medication refill  Other orders -     FLUoxetine (PROZAC) 20 MG tablet; Take 1 tablet (20 mg total) by mouth daily. -     valACYclovir (VALTREX) 1000 MG tablet; Take 1 tablet (1,000 mg total) by mouth 2 (two) times daily.      Edwina BarthMiguel Ayanah Snader, MD [facility]

## 2016-10-16 NOTE — Patient Instructions (Signed)
     IF you received an x-ray today, you will receive an invoice from [facility]. Please contact [facility] at [phone] with questions or concerns regarding your invoice.   IF you received labwork today, you will receive an invoice from [facility]. Please contact [facility] at [phone] with questions or concerns regarding your invoice.   Our billing staff will not be able to assist you with questions regarding bills from these companies.  You will be contacted with the lab results as soon as they are available. The fastest way to get your results is to activate your My Chart account. Instructions are located on the last page of this paperwork. If you have not heard from us regarding the results in 2 weeks, please contact this office.     

## 2017-07-05 IMAGING — MR MR CHEST MEDIASTINUM WO/W CM
8 series · 16 of 16 positions shown · IV contrast (multihance)
Comparison: None.

CLINICAL DATA: Prominent soft tissue in the left anterior chest
wall

EXAM:
MRI CHEST WITHOUT AND WITH CONTRAST
TECHNIQUE: Multiplanar multisequence MRI of the left anterior chest was
performed prior to and following intravenous contrast.
CONTRAST:  15mL MULTIHANCE GADOBENATE DIMEGLUMINE 529 MG/ML IV SOLN

[Series 3: T1 · axial · 5.0mm · 0.70mm/px · z∈[-31,+125]mm · 2 of 26 slices shown]
[im 1/26]
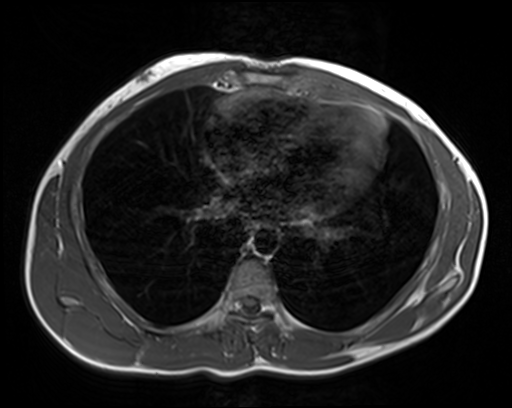
[im 26/26]
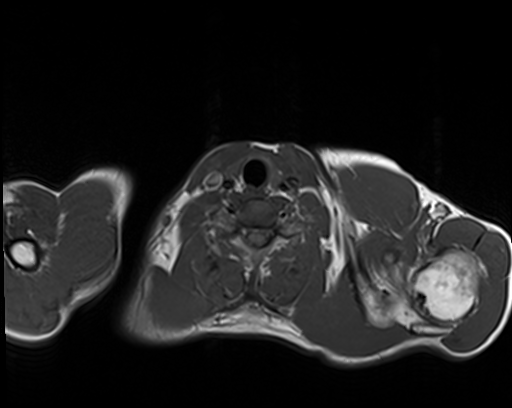

[Series 4: T2 fat-sat · axial · 5.0mm · 0.70mm/px · z∈[-31,+125]mm · 2 of 26 slices shown (1 of 2)]
[im 1/26]
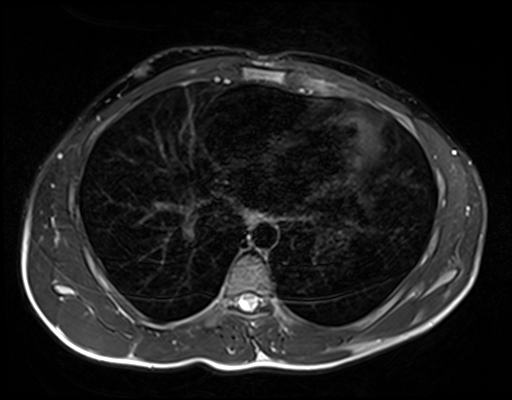
[im 26/26]
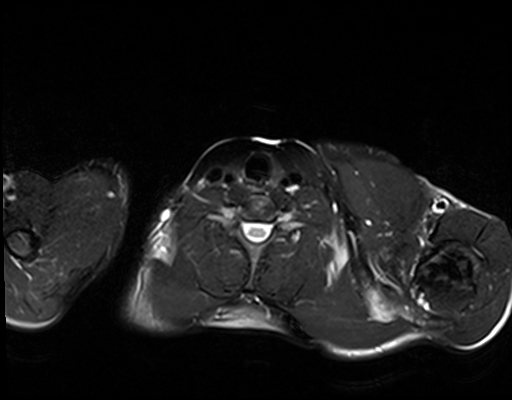

[Series 5: T1 fat-sat · axial · non-contrast · 5.0mm · 0.70mm/px · z∈[-31,+125]mm · 2 of 26 slices shown]
[im 1/26]
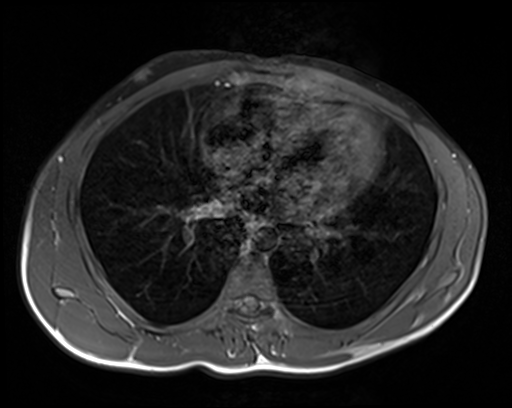
[im 26/26]
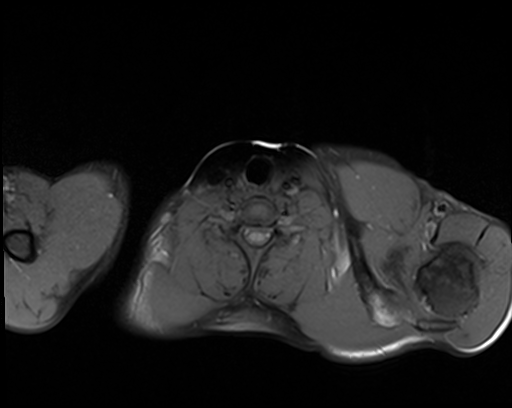

[Series 6: t1_tse sag · sagittal · 5.0mm · 0.47mm/px · 2 of 27 slices shown]
[im 1/27]
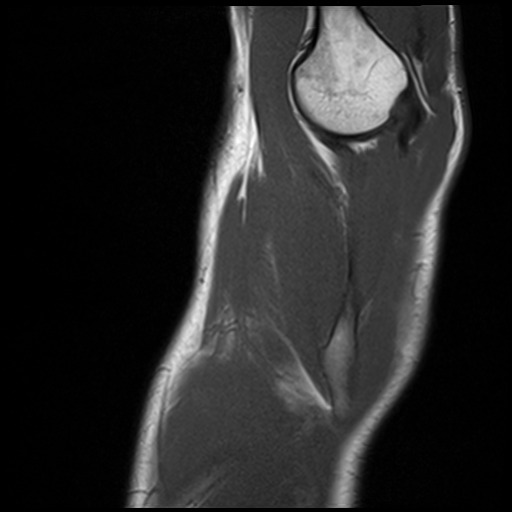
[im 27/27]
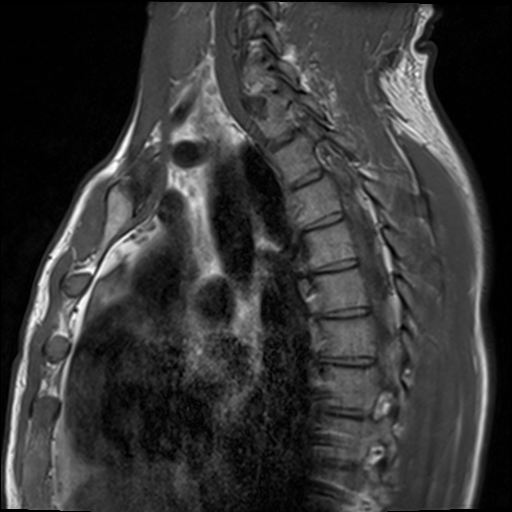

[Series 7: T2 fat-sat · sagittal · 5.0mm · 0.47mm/px · 2 of 27 slices shown (2 of 2)]
[im 1/27]
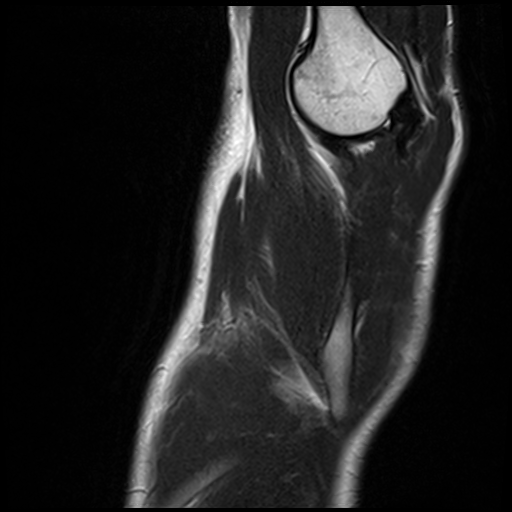
[im 27/27]
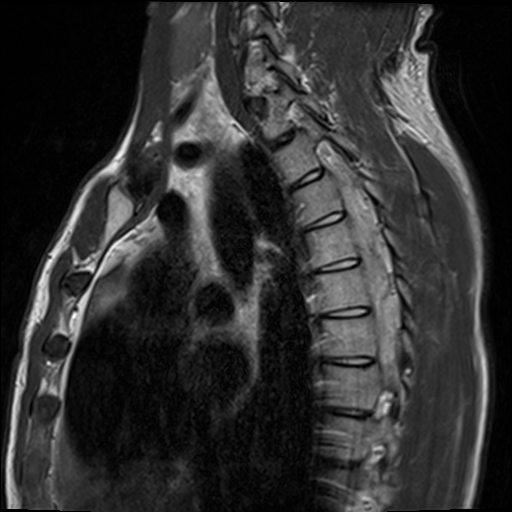

[Series 8: STIR · coronal · 4.0mm · 0.47mm/px · 2 of 24 slices shown]
[im 1/24]
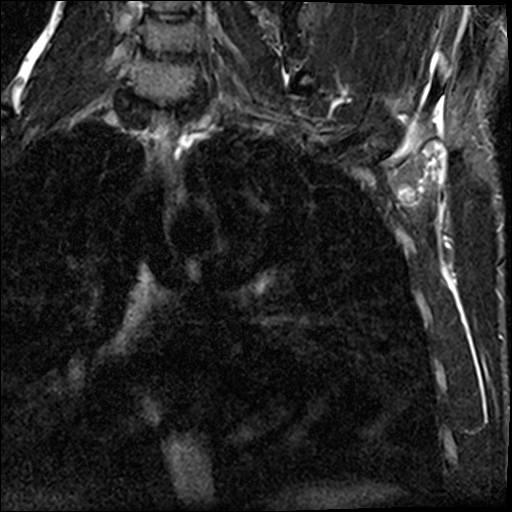
[im 24/24]
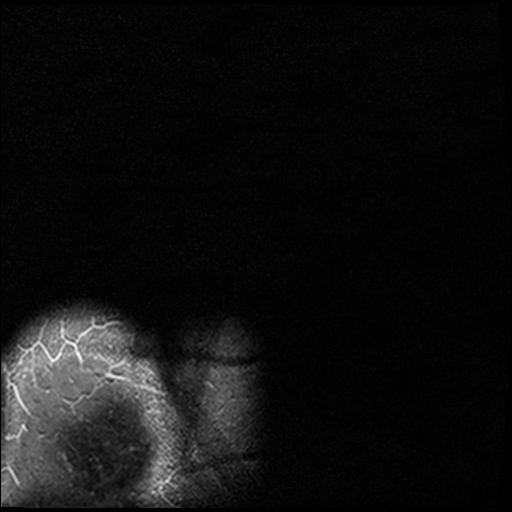

[Series 9: post axial fs · axial · 5.0mm · 0.70mm/px · z∈[-31,+125]mm · 2 of 26 slices shown]
[im 1/26]
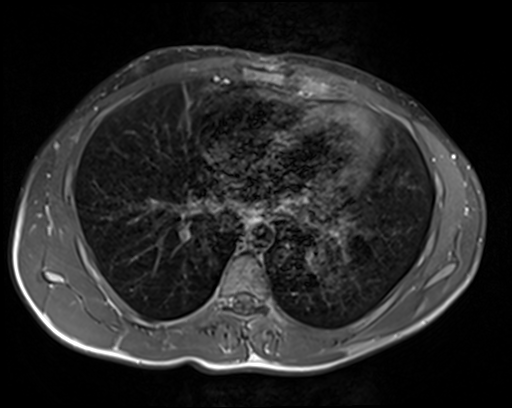
[im 26/26]
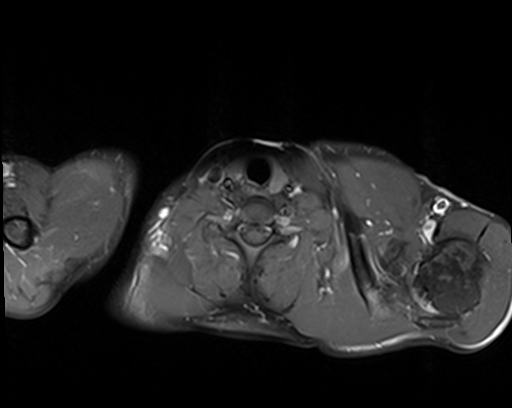

[Series 10: post sag fs · sagittal · 5.0mm · 0.47mm/px · 2 of 27 slices shown]
[im 1/27]
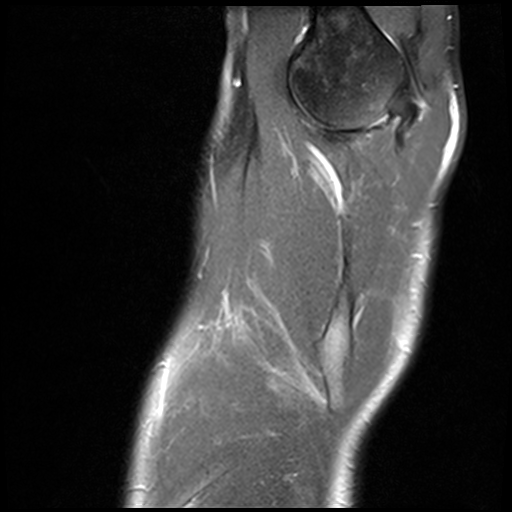
[im 27/27]
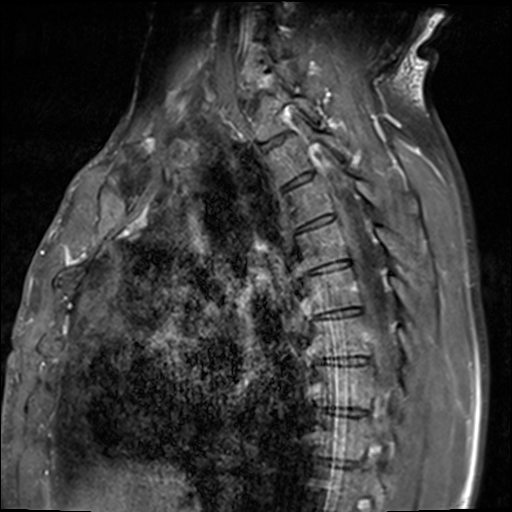

[16 of 16 positions shown; findings below may reference images not displayed]

FINDINGS: Bones/Joint/Cartilage

Mild bone marrow edema in the left lateral aspect of the sternum.
Adjacently there is a chondral fracture of the left anterior second
rib at the sternocostal junction with mild surrounding soft tissue
edema and enhancement.

Mild edema in the left anterior third rib at the sternocostal
junction with mild enhancement.

No other fracture or dislocation. Normal alignment. No joint
effusion.

Muscles and Tendons
Muscles are otherwise normal in signal.  No muscle atrophy.

Soft tissue
No fluid collection or hematoma. No soft tissue mass. Prominent
thymic tissue in the anterior mediastinum which may reflect thymic
remanent versus thymic hyperplasia. Follow-up CT chest is
recommended in 5 months from today for revaluation.
IMPRESSION: 1. Mild bone marrow edema in the left lateral aspect of the sternum.
Adjacently there is a chondral fracture of the left anterior second
rib at the sternocostal junction with mild surrounding soft tissue
edema and enhancement. Mild edema in the left anterior third rib at
the sternocostal junction with mild enhancement. Overall findings
are most concerning for posttraumatic etiology.
2. Prominent thymic tissue in the anterior mediastinum which may
reflect thymic remanent versus thymic hyperplasia. Follow-up CT
chest is recommended in 5 months from today for revaluation. At the
same time, the osseous findings stated above can also be
re-evaluated.

## 2018-04-08 ENCOUNTER — Ambulatory Visit: Payer: Self-pay | Admitting: Emergency Medicine

## 2018-05-05 ENCOUNTER — Encounter: Payer: Self-pay | Admitting: Emergency Medicine

## 2018-05-05 ENCOUNTER — Other Ambulatory Visit: Payer: Self-pay

## 2018-05-05 ENCOUNTER — Ambulatory Visit (INDEPENDENT_AMBULATORY_CARE_PROVIDER_SITE_OTHER): Payer: No Typology Code available for payment source | Admitting: Emergency Medicine

## 2018-05-05 VITALS — BP 110/58 | HR 62 | Temp 98.1°F | Resp 16 | Ht 67.5 in | Wt 152.4 lb

## 2018-05-05 DIAGNOSIS — L03317 Cellulitis of buttock: Secondary | ICD-10-CM

## 2018-05-05 DIAGNOSIS — L0231 Cutaneous abscess of buttock: Secondary | ICD-10-CM

## 2018-05-05 MED ORDER — DOXYCYCLINE HYCLATE 100 MG PO TABS: 100.0000 mg | ORAL_TABLET | Freq: Two times a day (BID) | ORAL | 3 refills | Status: AC

## 2018-05-05 NOTE — Progress Notes (Signed)
Jermaine Hood 32 y.o.   Chief Complaint  Patient presents with  . SKIN ABCESS    x 2 weeks    HISTORY OF PRESENT ILLNESS: This is a 33 y.o. male complaining of abscess to right buttock that started 2 weeks ago.  Several lesions, 2 of them better, third 1 still infected.  No fever or chills.  No other significant symptoms.  Has history of the same in the past.  Recurrent problem.  HPI   Prior to Admission medications   Medication Sig Start Date End Date Taking? Authorizing Provider  FLUoxetine (PROZAC) 20 MG tablet Take 1 tablet (20 mg total) by mouth daily. 10/16/16  Yes Jermaine Hood, Jermaine Kempf, MD  METHADONE HCL PO Take 48 mg by mouth daily.   Yes [provider]  valACYclovir (VALTREX) 1000 MG tablet Take 1 tablet (1,000 mg total) by mouth 2 (two) times daily. 10/16/16  Yes Jermaine Hood, Jermaine Kempf, MD  doxycycline (VIBRA-TABS) 100 MG tablet Take 1 tablet (100 mg total) by mouth 2 (two) times daily for 7 days. 05/05/18 05/12/18  Jermaine Quint, MD    No Known Allergies  Patient Active Problem List   Diagnosis Date Noted  . History of herpes simplex infection 10/16/2016  . Depression 10/16/2016  . Medication refill 10/16/2016  . Atypical chest pain 05/03/2016  . Musculoskeletal pain 05/03/2016  . 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 Liver fibrosis 02/08/2015  . Chronic hepatitis C without hepatic coma (HCC) 03/02/2014  . Substance dependence, in remission (HCC) 03/26/2013    Past Medical History:  Diagnosis Date  . IV drug user     Past Surgical History:  Procedure Laterality Date  . APPENDECTOMY      Social History   Socioeconomic History  . Marital status: Single    Spouse name: Not on file  . Number of children: Not on file  . Years of education: Not on file  . Highest education level: Not on file  Occupational History  . Not on file  Social Needs  . Financial resource strain: Not on file  . Food insecurity:    Worry: Not on file    Inability: Not on file  . Transportation needs:    Medical: Not on file    Non-medical: Not on file  Tobacco Use  . Smoking status: Current Every Day Smoker    Packs/day: 1.00    Years: 12.00    Pack years: 12.00    Types: Cigarettes    Start date: 02/06/2003  . Smokeless tobacco: Never Used  . Tobacco comment: cutting back  Substance and Sexual Activity  . Alcohol use: No    Alcohol/week: 0.0 standard drinks    Comment: rarely  . Drug use: Yes    Types: Marijuana  . Sexual activity: Not on file  Lifestyle  . Physical activity:    Days per week: Not on file    Minutes per session: Not on file  . Stress: Not on file  Relationships  . Social connections:    Talks on phone: Not on file    Gets together: Not on file    Attends religious service: Not on file    Active member of club or organization: Not on file    Attends meetings of clubs or organizations: Not on file    Relationship status: Not on file  . Intimate partner violence:    Fear of current or ex partner: Not on file    Emotionally  abused: Not on file    Physically abused: Not on file    Forced sexual activity: Not on file  Other Topics Concern  . Not on file  Social History Narrative  . Not on file    History reviewed. No pertinent family history.   Review of Systems  Constitutional: Negative for chills and fever.  HENT: Negative for congestion and sore throat.   Respiratory: Negative for cough and shortness of breath.   Cardiovascular: Negative.  Negative for chest pain and palpitations.  Gastrointestinal: Negative for abdominal pain, diarrhea, nausea and vomiting.  Skin: Positive for rash.  Neurological: Negative for dizziness and headaches.  All other systems reviewed and are negative.  Vitals:   05/05/18 1334  BP: (!) 110/58  Pulse: 62  Resp: 16  Temp: 98.1 F (36.7 C)  SpO2: 97%     Physical Exam Vitals signs reviewed.  Constitutional:      Appearance: Normal appearance.  HENT:      Head: Normocephalic.     Nose: Nose normal.     Mouth/Throat:     Mouth: Mucous membranes are moist.     Pharynx: Oropharynx is clear.  Eyes:     Extraocular Movements: Extraocular movements intact.     Conjunctiva/sclera: Conjunctivae normal.     Pupils: Pupils are equal, round, and reactive to light.  Neck:     Musculoskeletal: Normal range of motion and neck supple.  Cardiovascular:     Rate and Rhythm: Normal rate and regular rhythm.     Heart sounds: Normal heart sounds.  Pulmonary:     Effort: Pulmonary effort is normal.     Breath sounds: Normal breath sounds.  Musculoskeletal: Normal range of motion.  Skin:    Capillary Refill: Capillary refill takes less than 2 seconds.     Comments: Right buttock: Hard and nonfluctuant erythematous area about 3 cm in diameter.  Warm and tender to touch.  Neurological:     General: No focal deficit present.     Mental Status: He is alert and oriented to person, place, and time.  Psychiatric:        Mood and Affect: Mood normal.        Behavior: Behavior normal.    A total of 25 minutes was spent in the room with the patient, greater than 50% of which was in counseling/coordination of care regarding differential diagnosis, treatment, medications, and need for follow-up if no better or worse.   ASSESSMENT & PLAN: Jermaine Hood was seen today for skin abcess.  Diagnoses and all orders for this visit:  Cellulitis of buttock -     doxycycline (VIBRA-TABS) 100 MG tablet; Take 1 tablet (100 mg total) by mouth 2 (two) times daily for 7 days.  Cutaneous abscess of buttock    Patient Instructions   Skin Abscess  A skin abscess is an infected area of your skin that contains pus and other material. An abscess can happen in any part of your body. Some abscesses break open (rupture) on their own. Most continue to get worse unless they are treated. The infection can spread deeper into the body and into your blood, which can make you feel sick. A  skin abscess is caused by germs that enter the skin through a cut or scrape. It can also be caused by blocked oil and sweat glands or infected hair follicles. This condition is usually treated by:  Draining the pus.  Taking antibiotic medicines.  Placing a warm, wet washcloth  over the abscess. Follow these instructions at home: Medicines   Take over-the-counter and prescription medicines only as told by your doctor.  If you were prescribed an antibiotic medicine, take it as told by your doctor. Do not stop taking the antibiotic even if you start to feel better. Abscess care   If you have an abscess that has not drained, place a warm, clean, wet washcloth over the abscess several times a day. Do this as told by your doctor.  Follow instructions from your doctor about how to take care of your abscess. Make sure you: ? Cover the abscess with a bandage (dressing). ? Change your bandage or gauze as told by your doctor. ? Wash your hands with soap and water before you change the bandage or gauze. If you cannot use soap and water, use hand sanitizer.  Check your abscess every day for signs that the infection is getting worse. Check for: ? More redness, swelling, or pain. ? More fluid or blood. ? Warmth. ? More pus or a bad smell. General instructions  To avoid spreading the infection: ? Do not share personal care items, towels, or hot tubs with others. ? Avoid making skin-to-skin contact with other people.  Keep all follow-up visits as told by your doctor. This is important. Contact a doctor if:  You have more redness, swelling, or pain around your abscess.  You have more fluid or blood coming from your abscess.  Your abscess feels warm when you touch it.  You have more pus or a bad smell coming from your abscess.  You have a fever.  Your muscles ache.  You have chills.  You feel sick. Get help right away if:  You have very bad (severe) pain.  You see red streaks on  your skin spreading away from the abscess. Summary  A skin abscess is an infected area of your skin that contains pus and other material.  The abscess is caused by germs that enter the skin through a cut or scrape. It can also be caused by blocked oil and sweat glands or infected hair follicles.  Follow your doctor's instructions on caring for your abscess, taking medicines, preventing infections, and keeping follow-up visits. This information is not intended to replace advice given to you by your health care provider. Make sure you discuss any questions you have with your health care provider. Document Released: 07/11/2007 Document Revised: 03/07/2017 Document Reviewed: 03/07/2017 Elsevier Interactive Patient Education  [employer].     If you have lab work done today you will be contacted with your lab results within the next 2 weeks.  If you have not heard from [place] then please contact us. The fastest way to get your results is to register for My Chart.   IF you received an x-ray today, you will receive an invoice from [facility]. Please contact [facility] at [phone] with questions or concerns regarding your invoice.   IF you received labwork today, you will receive an invoice from [facility]. Please contact [facility] at [phone] with questions or concerns regarding your invoice.   Our billing staff will not be able to assist you with questions regarding bills from these companies.  Cellulitis, Adult  Cellulitis is a skin infection. The infected area is often warm, red, swollen, and sore. It occurs most often in the arms and lower legs. It is very important to get treated for this condition. What are the causes? This condition is caused by bacteria. The bacteria  enter through a break in the skin, such as a cut, burn, insect bite, open sore, or crack. What increases the risk? This condition is more likely to occur in people who:  Have a weak body  defense system (immune system).  Have open cuts, burns, bites, or scrapes on the skin.  Are older than 31 years of age.  Have a blood sugar problem (diabetes).  Have a long-lasting (chronic) liver disease (cirrhosis) or kidney disease.  Are very overweight (obese).  Have a skin problem, such as: ? Itchy rash (eczema). ? Slow movement of blood in the veins (venous stasis). ? Fluid buildup below the skin (edema).  Have been treated with high-energy rays (radiation).  Use IV drugs. What are the signs or symptoms? Symptoms of this condition include:  Skin that is: ? Red. ? Streaking. ? Spotting. ? Swollen. ? Sore or painful when you touch it. ? Warm.  A fever.  Chills.  Blisters. How is this diagnosed? This condition is diagnosed based on:  Medical history.  Physical exam.  Blood tests.  Imaging tests. How is this treated? Treatment for this condition may include:  Medicines to treat infections or allergies.  Home care, such as: ? Rest. ? Placing cold or warm cloths (compresses) on the skin.  Hospital care, if the condition is very bad. Follow these instructions at home: Medicines  Take over-the-counter and prescription medicines only as told by your doctor.  If you were prescribed an antibiotic medicine, take it as told by your doctor. Do not stop taking it even if you start to feel better. General instructions   Drink enough fluid to keep your pee (urine) pale yellow.  Do not touch or rub the infected area.  Raise (elevate) the infected area above the level of your heart while you are sitting or lying down.  Place cold or warm cloths on the area as told by your doctor.  Keep all follow-up visits as told by your doctor. This is important. Contact a doctor if:  You have a fever.  You do not start to get better after 1-2 days of treatment.  Your bone or joint under the infected area starts to hurt after the skin has healed.  Your infection  comes back. This can happen in the same area or another area.  You have a swollen bump in the area.  You have new symptoms.  You feel ill and have muscle aches and pains. Get help right away if:  Your symptoms get worse.  You feel very sleepy.  You throw up (vomit) or have watery poop (diarrhea) for a long time.  You see red streaks coming from the area.  Your red area gets larger.  Your red area turns dark in color. These symptoms may represent a serious problem that is an emergency. Do not wait to see if the symptoms will go away. Get medical help right away. Call your local emergency services (911 in the [place] Do not drive yourself to the hospital. Summary  Cellulitis is a skin infection. The area is often warm, red, swollen, and sore.  This condition is treated with medicines, rest, and cold and warm cloths.  Take all medicines only as told by your doctor.  Tell your doctor if symptoms do not start to get better after 1-2 days of treatment. This information is not intended to replace advice given to you by your health care provider. Make sure you discuss any questions you have with your health  care provider. Document Released: 07/11/2007 Document Revised: 06/13/2017 Document Reviewed: 06/13/2017 Elsevier Interactive Patient Education  2019 [employer].  You will be contacted with the lab results as soon as they are available. The fastest way to get your results is to activate your My Chart account. Instructions are located on the last page of this paperwork. If you have not heard from [place] regarding the results in 2 weeks, please contact this office.         Edwina Barth, MD [facility]

## 2018-05-05 NOTE — Patient Instructions (Addendum)
Skin Abscess  A skin abscess is an infected area of your skin that contains pus and other material. An abscess can happen in any part of your body. Some abscesses break open (rupture) on their own. Most continue to get worse unless they are treated. The infection can spread deeper into the body and into your blood, which can make you feel sick. A skin abscess is caused by germs that enter the skin through a cut or scrape. It can also be caused by blocked oil and sweat glands or infected hair follicles. This condition is usually treated by:  Draining the pus.  Taking antibiotic medicines.  Placing a warm, wet washcloth over the abscess. Follow these instructions at home: Medicines   Take over-the-counter and prescription medicines only as told by your doctor.  If you were prescribed an antibiotic medicine, take it as told by your doctor. Do not stop taking the antibiotic even if you start to feel better. Abscess care   If you have an abscess that has not drained, place a warm, clean, wet washcloth over the abscess several times a day. Do this as told by your doctor.  Follow instructions from your doctor about how to take care of your abscess. Make sure you: ? Cover the abscess with a bandage (dressing). ? Change your bandage or gauze as told by your doctor. ? Wash your hands with soap and water before you change the bandage or gauze. If you cannot use soap and water, use hand sanitizer.  Check your abscess every day for signs that the infection is getting worse. Check for: ? More redness, swelling, or pain. ? More fluid or blood. ? Warmth. ? More pus or a bad smell. General instructions  To avoid spreading the infection: ? Do not share personal care items, towels, or hot tubs with others. ? Avoid making skin-to-skin contact with other people.  Keep all follow-up visits as told by your doctor. This is important. Contact a doctor if:  You have more redness, swelling, or pain  around your abscess.  You have more fluid or blood coming from your abscess.  Your abscess feels warm when you touch it.  You have more pus or a bad smell coming from your abscess.  You have a fever.  Your muscles ache.  You have chills.  You feel sick. Get help right away if:  You have very bad (severe) pain.  You see red streaks on your skin spreading away from the abscess. Summary  A skin abscess is an infected area of your skin that contains pus and other material.  The abscess is caused by germs that enter the skin through a cut or scrape. It can also be caused by blocked oil and sweat glands or infected hair follicles.  Follow your doctor's instructions on caring for your abscess, taking medicines, preventing infections, and keeping follow-up visits. This information is not intended to replace advice given to you by your health care provider. Make sure you discuss any questions you have with your health care provider. Document Released: 07/11/2007 Document Revised: 03/07/2017 Document Reviewed: 03/07/2017 Elsevier Interactive Patient Education  [employer].     If you have lab work done today you will be contacted with your lab results within the next 2 weeks.  If you have not heard from [place] then please contact us. The fastest way to get your results is to register for My Chart.   IF you received an x-ray today, you will  receive an Economist from [facility]. Please contact [facility] at [phone] with questions or concerns regarding your invoice.   IF you received labwork today, you will receive an invoice from [place]. Please contact [facility] at [phone] with questions or concerns regarding your invoice.   Our billing staff will not be able to assist you with questions regarding bills from these companies.  Cellulitis, Adult  Cellulitis is a skin infection. The infected area is often warm, red, swollen, and sore. It occurs most often  in the arms and lower legs. It is very important to get treated for this condition. What are the causes? This condition is caused by bacteria. The bacteria enter through a break in the skin, such as a cut, burn, insect bite, open sore, or crack. What increases the risk? This condition is more likely to occur in people who:  Have a weak body defense system (immune system).  Have open cuts, burns, bites, or scrapes on the skin.  Are older than 31 years of age.  Have a blood sugar problem (diabetes).  Have a long-lasting (chronic) liver disease (cirrhosis) or kidney disease.  Are very overweight (obese).  Have a skin problem, such as: ? Itchy rash (eczema). ? Slow movement of blood in the veins (venous stasis). ? Fluid buildup below the skin (edema).  Have been treated with high-energy rays (radiation).  Use IV drugs. What are the signs or symptoms? Symptoms of this condition include:  Skin that is: ? Red. ? Streaking. ? Spotting. ? Swollen. ? Sore or painful when you touch it. ? Warm.  A fever.  Chills.  Blisters. How is this diagnosed? This condition is diagnosed based on:  Medical history.  Physical exam.  Blood tests.  Imaging tests. How is this treated? Treatment for this condition may include:  Medicines to treat infections or allergies.  Home care, such as: ? Rest. ? Placing cold or warm cloths (compresses) on the skin.  Hospital care, if the condition is very bad. Follow these instructions at home: Medicines  Take over-the-counter and prescription medicines only as told by your doctor.  If you were prescribed an antibiotic medicine, take it as told by your doctor. Do not stop taking it even if you start to feel better. General instructions   Drink enough fluid to keep your pee (urine) pale yellow.  Do not touch or rub the infected area.  Raise (elevate) the infected area above the level of your heart while you are sitting or lying down.   Place cold or warm cloths on the area as told by your doctor.  Keep all follow-up visits as told by your doctor. This is important. Contact a doctor if:  You have a fever.  You do not start to get better after 1-2 days of treatment.  Your bone or joint under the infected area starts to hurt after the skin has healed.  Your infection comes back. This can happen in the same area or another area.  You have a swollen bump in the area.  You have new symptoms.  You feel ill and have muscle aches and pains. Get help right away if:  Your symptoms get worse.  You feel very sleepy.  You throw up (vomit) or have watery poop (diarrhea) for a long time.  You see red streaks coming from the area.  Your red area gets larger.  Your red area turns dark in color. These symptoms may represent a serious problem that is an emergency.  Do not wait to see if the symptoms will go away. Get medical help right away. Call your local emergency services (911 in the [place] Do not drive yourself to the hospital. Summary  Cellulitis is a skin infection. The area is often warm, red, swollen, and sore.  This condition is treated with medicines, rest, and cold and warm cloths.  Take all medicines only as told by your doctor.  Tell your doctor if symptoms do not start to get better after 1-2 days of treatment. This information is not intended to replace advice given to you by your health care provider. Make sure you discuss any questions you have with your health care provider. Document Released: 07/11/2007 Document Revised: 06/13/2017 Document Reviewed: 06/13/2017 Elsevier Interactive Patient Education  2019 [employer].  You will be contacted with the lab results as soon as they are available. The fastest way to get your results is to activate your My Chart account. Instructions are located on the last page of this paperwork. If you have not heard from [place] regarding the results in 2 weeks, please  contact this office.

## 2018-08-03 ENCOUNTER — Emergency Department (HOSPITAL_COMMUNITY)
Admission: EM | Admit: 2018-08-03 | Discharge: 2018-08-03 | Disposition: A | Discharge status: Home / Self Care | Payer: Managed Care, Other (non HMO) | Attending: Emergency Medicine | Admitting: Emergency Medicine

## 2018-08-03 ENCOUNTER — Other Ambulatory Visit: Payer: Self-pay

## 2018-08-03 ENCOUNTER — Encounter (HOSPITAL_COMMUNITY): Payer: Self-pay | Admitting: *Deleted

## 2018-08-03 DIAGNOSIS — S0502XA Injury of conjunctiva and corneal abrasion without foreign body, left eye, initial encounter: Secondary | ICD-10-CM | POA: Insufficient documentation

## 2018-08-03 DIAGNOSIS — F1210 Cannabis abuse, uncomplicated: Secondary | ICD-10-CM | POA: Diagnosis not present

## 2018-08-03 DIAGNOSIS — X58XXXA Exposure to other specified factors, initial encounter: Secondary | ICD-10-CM | POA: Insufficient documentation

## 2018-08-03 DIAGNOSIS — Y998 Other external cause status: Secondary | ICD-10-CM | POA: Diagnosis not present

## 2018-08-03 DIAGNOSIS — F17210 Nicotine dependence, cigarettes, uncomplicated: Secondary | ICD-10-CM | POA: Diagnosis not present

## 2018-08-03 DIAGNOSIS — S0592XA Unspecified injury of left eye and orbit, initial encounter: Secondary | ICD-10-CM | POA: Diagnosis present

## 2018-08-03 DIAGNOSIS — Y93H2 Activity, gardening and landscaping: Secondary | ICD-10-CM | POA: Diagnosis not present

## 2018-08-03 DIAGNOSIS — Y92017 Garden or yard in single-family (private) house as the place of occurrence of the external cause: Secondary | ICD-10-CM | POA: Insufficient documentation

## 2018-08-03 MED ORDER — IBUPROFEN 800 MG PO TABS: 800.0000 mg | ORAL_TABLET | Freq: Three times a day (TID) | ORAL | 0 refills | Status: DC | PRN

## 2018-08-03 MED ORDER — SULFACETAMIDE SODIUM 10 % OP SOLN: 2.0000 [drp] | OPHTHALMIC | 0 refills | Status: DC

## 2018-08-03 MED ORDER — TRAMADOL HCL 50 MG PO TABS
50.0000 mg | ORAL_TABLET | Freq: Four times a day (QID) | ORAL | 0 refills | Status: DC | PRN
Start: 2018-08-03 — End: 2019-08-18

## 2018-08-03 MED ORDER — FLUORESCEIN SODIUM 1 MG OP STRP
1.0000 | ORAL_STRIP | Freq: Once | OPHTHALMIC | Status: AC
  Administered 2018-08-03: 1 via OPHTHALMIC
  Filled 2018-08-03: qty 1

## 2018-08-03 MED ORDER — TETRACAINE HCL 0.5 % OP SOLN
2.0000 [drp] | Freq: Once | OPHTHALMIC | Status: AC
  Administered 2018-08-03: 2 [drp] via OPHTHALMIC
  Filled 2018-08-03: qty 4

## 2018-08-03 NOTE — ED Triage Notes (Signed)
Pt complains of pain, redness, and blurred vision to left eye since doing yard work yesterday. Pt believes a piece of bamboo got in his eye.

## 2018-08-03 NOTE — Discharge Instructions (Signed)
Return here as needed. Follow up with the eye doctor provided.  °

## 2018-08-03 NOTE — ED Notes (Signed)
Patient went out to car for a second per registration.

## 2018-08-04 NOTE — ED Provider Notes (Signed)
[facility] DEPT Provider Note   CSN: [id number] Arrival date & time: 08/03/18  3557     History   Chief Complaint Chief Complaint  Patient presents with  . Eye Injury    left    HPI Jermaine Hood is a 33 y.o. male.     HPI  Past Medical History:  Diagnosis Date  . IV drug user     Patient Active Problem List   Diagnosis Date Noted  . History of herpes simplex infection 10/16/2016  . Depression 10/16/2016  . Medication refill 10/16/2016  . Atypical chest pain 05/03/2016  . Musculoskeletal pain 05/03/2016  . 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 Liver fibrosis 02/08/2015  . Chronic hepatitis C without hepatic coma ([place]) 03/02/2014  . Substance dependence, in remission ([place]) 03/26/2013    Past Surgical History:  Procedure Laterality Date  . APPENDECTOMY          Home Medications    Prior to Admission medications   Medication Sig Start Date End Date Taking? Authorizing Provider  FLUoxetine (PROZAC) 20 MG tablet Take 1 tablet (20 mg total) by mouth daily. 10/16/16   Horald Pollen, MD  ibuprofen (ADVIL) 800 MG tablet Take 1 tablet (800 mg total) by mouth every 8 (eight) hours as needed. 08/03/18   Judson Tsan, Harrell Gave, PA-C  METHADONE HCL PO Take 48 mg by mouth daily.    [provider]  sulfacetamide (BLEPH-10) 10 % ophthalmic solution Place 2 drops into the right eye every 4 (four) hours. 08/03/18   Yanai Hobson, Harrell Gave, PA-C  traMADol (ULTRAM) 50 MG tablet Take 1 tablet (50 mg total) by mouth every 6 (six) hours as needed for severe pain. 08/03/18   Theodis Kinsel, Harrell Gave, PA-C  valACYclovir (VALTREX) 1000 MG tablet Take 1 tablet (1,000 mg total) by mouth 2 (two) times daily. 10/16/16   Horald Pollen, MD    Family History No family history on file.  Social History Social History   Tobacco Use  . Smoking status: Current Every Day Smoker    Packs/day: 1.00    Years: 12.00    Pack years: 12.00     Types: Cigarettes    Start date: 02/06/2003  . Smokeless tobacco: Never Used  . Tobacco comment: cutting back  Substance Use Topics  . Alcohol use: No    Alcohol/week: 0.0 standard drinks    Comment: rarely  . Drug use: Yes    Types: Marijuana     Allergies   Patient has no known allergies.   Review of Systems Review of Systems All other systems negative except as documented in the HPI. All pertinent positives and negatives as reviewed in the HPI.  Physical Exam Updated Vital Signs BP 136/79 (BP Location: Right Arm)   Pulse 71   Temp 98.6 F (37 C) (Oral)   Resp 17   SpO2 100%   Physical Exam Vitals signs and nursing note reviewed.  Constitutional:      General: He is not in acute distress.    Appearance: He is well-developed.  HENT:     Head: Normocephalic and atraumatic.  Eyes:     General: Lids are everted, no foreign bodies appreciated. Vision grossly intact.        Left eye: No foreign body.     Extraocular Movements:     Left eye: Normal extraocular motion.     Conjunctiva/sclera:     Left eye: Left conjunctiva is injected. Chemosis present.  Pupils: Pupils are equal, round, and reactive to light.     Left eye: Corneal abrasion and fluorescein uptake present.   Pulmonary:     Effort: Pulmonary effort is normal.  Skin:    General: Skin is warm and dry.  Neurological:     Mental Status: He is alert and oriented to person, place, and time.      ED Treatments / Results  Labs (all labs ordered are listed, but only abnormal results are displayed) Labs Reviewed - No data to display  EKG    Radiology No results found.  Procedures Procedures (including critical care time)  Medications Ordered in ED Medications  tetracaine (PONTOCAINE) 0.5 % ophthalmic solution 2 drop (2 drops Left Eye Given by Other 08/03/18 0727)  fluorescein ophthalmic strip 1 strip (1 strip Left Eye Given by Other 08/03/18 [id number])     Initial Impression / Assessment and  Plan / ED Course  I have reviewed the triage vital signs and the nursing notes.  Pertinent labs & imaging results that were available during my care of the patient were reviewed by me and considered in my medical decision making (see chart for details).        She will be treated for corneal abrasion and referred to ophthalmology.  The patient is advised to return for any worsening in his condition.  Patient agrees the plan and all questions were answered. Final Clinical Impressions(s) / ED Diagnoses   Final diagnoses:  Abrasion of left cornea, initial encounter    ED Discharge Orders         Ordered    traMADol (ULTRAM) 50 MG tablet  Every 6 hours PRN     08/03/18 0815    sulfacetamide (BLEPH-10) 10 % ophthalmic solution  Every 4 hours     08/03/18 0815    ibuprofen (ADVIL) 800 MG tablet  Every 8 hours PRN     08/03/18 0815           Charlestine NightLawyer, Rishita Petron, PA-C 08/04/18 2159    Cathren LaineSteinl, Kevin, MD 08/08/18 1030

## 2018-11-24 ENCOUNTER — Other Ambulatory Visit: Payer: Self-pay

## 2018-11-24 DIAGNOSIS — Z20822 Contact with and (suspected) exposure to covid-19: Secondary | ICD-10-CM

## 2018-11-26 LAB — NOVEL CORONAVIRUS, NAA: SARS-CoV-2, NAA: NOT DETECTED

## 2019-04-20 ENCOUNTER — Other Ambulatory Visit: Payer: Self-pay | Admitting: Emergency Medicine

## 2019-04-20 NOTE — Telephone Encounter (Signed)
Patient requesting refill sulfacetamide (BLEPH-10) 10 % ophthalmic solution due to his left eye cornary being scratch. Patient tried OTC but does not offer any relief. Please advise  [id number] [place] Otto, [place] - [address] AT [facility] OF [address] Phone:  [phone]  Fax:  [phone]

## 2019-04-20 NOTE — Telephone Encounter (Signed)
Pt was seen in ED 08/03/2018 for this issue and pt still having issues with sx. [facility] does not make appts for [place]. Rx request routed to office to decide to refill or call pt and make appt or referral.

## 2019-06-19 ENCOUNTER — Encounter (HOSPITAL_COMMUNITY): Payer: Self-pay | Admitting: *Deleted

## 2019-06-19 ENCOUNTER — Other Ambulatory Visit: Payer: Self-pay

## 2019-06-19 ENCOUNTER — Emergency Department (HOSPITAL_COMMUNITY)
Admission: EM | Admit: 2019-06-19 | Discharge: 2019-06-19 | Disposition: A | Discharge status: Home / Self Care | Payer: PRIVATE HEALTH INSURANCE | Attending: Emergency Medicine | Admitting: Emergency Medicine

## 2019-06-19 DIAGNOSIS — S0502XA Injury of conjunctiva and corneal abrasion without foreign body, left eye, initial encounter: Secondary | ICD-10-CM | POA: Insufficient documentation

## 2019-06-19 DIAGNOSIS — H10213 Acute toxic conjunctivitis, bilateral: Secondary | ICD-10-CM | POA: Insufficient documentation

## 2019-06-19 DIAGNOSIS — T551X1A Toxic effect of detergents, accidental (unintentional), initial encounter: Secondary | ICD-10-CM | POA: Insufficient documentation

## 2019-06-19 DIAGNOSIS — H5789 Other specified disorders of eye and adnexa: Secondary | ICD-10-CM

## 2019-06-19 DIAGNOSIS — X58XXXA Exposure to other specified factors, initial encounter: Secondary | ICD-10-CM | POA: Insufficient documentation

## 2019-06-19 DIAGNOSIS — S0501XA Injury of conjunctiva and corneal abrasion without foreign body, right eye, initial encounter: Secondary | ICD-10-CM | POA: Insufficient documentation

## 2019-06-19 DIAGNOSIS — Y92520 Airport as the place of occurrence of the external cause: Secondary | ICD-10-CM | POA: Insufficient documentation

## 2019-06-19 DIAGNOSIS — F1210 Cannabis abuse, uncomplicated: Secondary | ICD-10-CM | POA: Insufficient documentation

## 2019-06-19 DIAGNOSIS — Y9389 Activity, other specified: Secondary | ICD-10-CM | POA: Insufficient documentation

## 2019-06-19 DIAGNOSIS — F17210 Nicotine dependence, cigarettes, uncomplicated: Secondary | ICD-10-CM | POA: Insufficient documentation

## 2019-06-19 DIAGNOSIS — Y990 Civilian activity done for income or pay: Secondary | ICD-10-CM | POA: Insufficient documentation

## 2019-06-19 MED ORDER — HYDROCODONE-ACETAMINOPHEN 5-325 MG PO TABS
1.0000 | ORAL_TABLET | Freq: Once | ORAL | Status: AC
  Administered 2019-06-19: 1 via ORAL
  Filled 2019-06-19: qty 1

## 2019-06-19 MED ORDER — HYDROCODONE-ACETAMINOPHEN 5-325 MG PO TABS: 1.0000 | ORAL_TABLET | Freq: Four times a day (QID) | ORAL | 0 refills | Status: DC | PRN

## 2019-06-19 MED ORDER — FLUORESCEIN SODIUM 1 MG OP STRP
1.0000 | ORAL_STRIP | Freq: Once | OPHTHALMIC | Status: AC
  Administered 2019-06-19: 1 via OPHTHALMIC
  Filled 2019-06-19: qty 1

## 2019-06-19 MED ORDER — ERYTHROMYCIN 5 MG/GM OP OINT
TOPICAL_OINTMENT | OPHTHALMIC | 0 refills | Status: DC
Start: 2019-06-19 — End: 2019-08-18

## 2019-06-19 MED ORDER — TETRACAINE HCL 0.5 % OP SOLN
2.0000 [drp] | Freq: Once | OPHTHALMIC | Status: AC
  Administered 2019-06-19: 2 [drp] via OPHTHALMIC
  Filled 2019-06-19: qty 4

## 2019-06-19 NOTE — Discharge Instructions (Addendum)
Take pain medication as needed.  And as directed.  Put the erythromycin eye ointment in the both eyes 3 times a day.  Call ophthalmology on Monday morning for follow-up time.  Information provided above.  Work note provided to be out of work through Tuesday.

## 2019-06-19 NOTE — ED Provider Notes (Signed)
[facility]-EMERGENCY DEPT Provider Note   CSN: [id number] Arrival date & time: 06/19/19  4742     History Chief Complaint  Patient presents with  . Eye Problem    chemicals in eye, cannot see    Jermaine Hood is a 34 y.o. male.  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  Using 2 compounds 1 called top, which is an alkaline mixture and the other is Lupita Leash troll.  It is sprayed on with a spraying container and then pressure washed off.  I started the job around 12 midnight and about 3:30 in the morning he noticed that his vision was very blurry.  Then around 4:30 in the morning he got bilateral eye pain.  Does not wear any eye protection and has not in the past.  Does not wear contacts does not wear glasses.        Past Medical History:  Diagnosis Date  . IV drug user     Patient Active Problem List   Diagnosis Date Noted  . History of herpes simplex infection 10/16/2016  . Depression 10/16/2016  . Medication refill 10/16/2016  . Atypical chest pain 05/03/2016  . Musculoskeletal pain 05/03/2016  . 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 Liver fibrosis 02/08/2015  . Chronic hepatitis C without hepatic coma ([facility]) 03/02/2014  . Substance dependence, in remission ([facility]) 03/26/2013    Past Surgical History:  Procedure Laterality Date  . APPENDECTOMY         No family history on file.  Social History   Tobacco Use  . Smoking status: Current Every Day Smoker    Packs/day: 1.00    Years: 12.00    Pack years: 12.00    Types: Cigarettes    Start date: 02/06/2003  . Smokeless tobacco: Never Used  . Tobacco comment: cutting back  Substance Use Topics  . Alcohol use: No    Alcohol/week: 0.0 standard drinks    Comment: rarely  . Drug use: Yes    Types: Marijuana    Home Medications Prior to Admission medications   Medication Sig Start Date End Date Taking? Authorizing Provider    erythromycin ophthalmic ointment Place a 1/2 inch ribbon of ointment into the lower eyelid of both eyes 3 times a day approximately every 8 hours until the ophthalmologist says you can stop.. 06/19/19   Vanetta Mulders, MD  FLUoxetine (PROZAC) 20 MG tablet Take 1 tablet (20 mg total) by mouth daily. Patient not taking: Reported on 06/19/2019 10/16/16   Georgina Quint, MD  HYDROcodone-acetaminophen (NORCO/VICODIN) 5-325 MG tablet Take 1 tablet by mouth every 6 (six) hours as needed for moderate pain. 06/19/19   Vanetta Mulders, MD  ibuprofen (ADVIL) 800 MG tablet Take 1 tablet (800 mg total) by mouth every 8 (eight) hours as needed. Patient not taking: Reported on 06/19/2019 08/03/18   Charlestine Night, PA-C  sulfacetamide (BLEPH-10) 10 % ophthalmic solution Place 2 drops into the right eye every 4 (four) hours. Patient not taking: Reported on 06/19/2019 08/03/18   Charlestine Night, PA-C  traMADol (ULTRAM) 50 MG tablet Take 1 tablet (50 mg total) by mouth every 6 (six) hours as needed for severe pain. Patient not taking: Reported on 06/19/2019 08/03/18   Charlestine Night, PA-C  valACYclovir (VALTREX) 1000 MG tablet Take 1 tablet (1,000 mg total) by mouth 2 (two) times daily. Patient not taking: Reported on 06/19/2019 10/16/16   Georgina Quint,  MD    Allergies    Patient has no known allergies.  Review of Systems   Review of Systems  Constitutional: Negative for chills and fever.  HENT: Negative for congestion, rhinorrhea and sore throat.   Eyes: Positive for photophobia, pain, redness and visual disturbance.  Respiratory: Negative for cough and shortness of breath.   Cardiovascular: Negative for chest pain and leg swelling.  Gastrointestinal: Negative for abdominal pain, diarrhea, nausea and vomiting.  Genitourinary: Negative for dysuria.  Musculoskeletal: Negative for back pain and neck pain.  Skin: Negative for rash.  Neurological: Negative for dizziness, light-headedness  and headaches.  Hematological: Does not bruise/bleed easily.  Psychiatric/Behavioral: Negative for confusion.    Physical Exam Updated Vital Signs BP (!) 149/87 (BP Location: Right Arm)   Pulse 66   Temp (!) 97.3 F (36.3 C) (Oral)   Resp 15   SpO2 99%   Physical Exam Vitals and nursing note reviewed.  Constitutional:      Appearance: Normal appearance. He is well-developed.  HENT:     Head: Normocephalic and atraumatic.  Eyes:     Extraocular Movements: Extraocular movements intact.     Pupils: Pupils are equal, round, and reactive to light.     Comments: Bilateral conjunctival erythema.  Anterior chambers normal.  Fluorescein stain shows dye uptake of the left cornea measuring about 3 mm.  On the right cornea is about 2 mm more intense dye uptake.  No diffuse dye uptake.  pH paper was used and that came out as a pH of 8.  Cardiovascular:     Rate and Rhythm: Normal rate and regular rhythm.     Heart sounds: No murmur.  Pulmonary:     Effort: Pulmonary effort is normal. No respiratory distress.     Breath sounds: Normal breath sounds.  Abdominal:     Palpations: Abdomen is soft.     Tenderness: There is no abdominal tenderness.  Musculoskeletal:        General: Normal range of motion.     Cervical back: Normal range of motion and neck supple.  Skin:    General: Skin is warm and dry.  Neurological:     General: No focal deficit present.     Mental Status: He is alert and oriented to person, place, and time.     ED Results / Procedures / Treatments   Labs (all labs ordered are listed, but only abnormal results are displayed) Labs Reviewed - No data to display  EKG None  Radiology No results found.  Procedures Procedures (including critical care time)  Medications Ordered in ED Medications  fluorescein ophthalmic strip 1 strip (1 strip Both Eyes Given by Other 06/19/19 0900)  tetracaine (PONTOCAINE) 0.5 % ophthalmic solution 2 drop (2 drops Both Eyes Given  by Other 06/19/19 0900)  HYDROcodone-acetaminophen (NORCO/VICODIN) 5-325 MG per tablet 1 tablet (1 tablet Oral Given 06/19/19 0859)    ED Course  I have reviewed the triage vital signs and the nursing notes.  Pertinent labs & imaging results that were available during my care of the patient were reviewed by me and considered in my medical decision making (see chart for details).    MDM Rules/Calculators/A&P                      Bilateral eyes showing some dye uptake.  Is actually spherical in nature so it was almost if there was a splash type injury.  Discussed with ophthalmology on-call Dr.  Richard Alden Hipp.  We will go ahead and give erythromycin ointment.  Recommending to use it 3 times a day.  Also hydrocodone for pain.  We will take him out of work until Tuesday.  Ophthalmology will see him Monday.  Injury secondary to degreaser effects on the cornea.    Final Clinical Impression(s) / ED Diagnoses Final diagnoses:  Eye irritation  Chemical conjunctivitis of both eyes  Bilateral corneal abrasions, initial encounter    Rx / DC Orders ED Discharge Orders         Ordered    erythromycin ophthalmic ointment     06/19/19 1051    HYDROcodone-acetaminophen (NORCO/VICODIN) 5-325 MG tablet  Every 6 hours PRN,   Status:  Discontinued     06/19/19 1051    HYDROcodone-acetaminophen (NORCO/VICODIN) 5-325 MG tablet  Every 6 hours PRN     06/19/19 1052           Vanetta Mulders, MD 06/19/19 1054

## 2019-06-19 NOTE — ED Triage Notes (Addendum)
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 He states his eyes became blurrier over the course of cleaning from 12am-4am, until he couldn't see at ~5am. He was not using protective eye gear.   Spoke to his supervisor, Cherlynn Perches, who says the following cleaners are used: Top gun brand degreaser dinotrol remover [id number]

## 2019-08-18 ENCOUNTER — Encounter: Payer: Self-pay | Admitting: Emergency Medicine

## 2019-08-18 ENCOUNTER — Telehealth (INDEPENDENT_AMBULATORY_CARE_PROVIDER_SITE_OTHER): Payer: No Typology Code available for payment source | Admitting: Emergency Medicine

## 2019-08-18 DIAGNOSIS — B009 Herpesviral infection, unspecified: Secondary | ICD-10-CM

## 2019-08-18 DIAGNOSIS — Z8619 Personal history of other infectious and parasitic diseases: Secondary | ICD-10-CM

## 2019-08-18 MED ORDER — VALACYCLOVIR HCL 1 G PO TABS: 1000.0000 mg | ORAL_TABLET | Freq: Two times a day (BID) | ORAL | 3 refills | Status: DC

## 2019-08-18 NOTE — Patient Instructions (Signed)
° ° ° °  If you have lab work done today you will be contacted with your lab results within the next 2 weeks.  If you have not heard from us then please contact us. The fastest way to get your results is to register for My Chart. ° ° °IF you received an x-ray today, you will receive an invoice from [facility]. Please contact [facility] at [phone] with questions or concerns regarding your invoice.  ° °IF you received labwork today, you will receive an invoice from [facility]. Please contact [facility] at [phone] with questions or concerns regarding your invoice.  ° °Our billing staff will not be able to assist you with questions regarding bills from these companies. ° °You will be contacted with the lab results as soon as they are available. The fastest way to get your results is to activate your My Chart account. Instructions are located on the last page of this paperwork. If you have not heard from us regarding the results in 2 weeks, please contact this office. °  ° ° ° °

## 2019-08-18 NOTE — Progress Notes (Signed)
Telemedicine Encounter- SOAP NOTE Established Patient  This telephone encounter was conducted with the patient's (or proxy's) verbal consent via audio telecommunications: yes/no: Yes Patient was instructed to have this encounter in a suitably private space; and to only have persons present to whom they give permission to participate. In addition, patient identity was confirmed by use of name plus two identifiers (DOB and address).  I discussed the limitations, risks, security and privacy concerns of performing an evaluation and management service by telephone and the availability of in person appointments. I also discussed with the patient that there may be a patient responsible charge related to this service. The patient expressed understanding and agreed to proceed.  I spent a total of TIME; 0 MIN TO 60 MIN: 20 minutes talking with the patient or their proxy.  Chief Complaint  Patient presents with   Medication Refill    valcyclovir - current outbreak for herpes    Subjective   Jermaine Hood is a 34 y.o. male established patient. Telephone visit today complaining of herpes outbreak.  Needs medication refill, valacyclovir.  No other complaints or medical concerns today. Previous office visit notes reviewed.  HPI   Patient Active Problem List   Diagnosis Date Noted   History of herpes simplex infection 10/16/2016   Depression 10/16/2016   Medication refill 10/16/2016   Liver fibrosis 02/08/2015   Chronic hepatitis C without hepatic coma (HCC) 03/02/2014   Substance dependence, in remission (HCC) 03/26/2013    Past Medical History:  Diagnosis Date   IV drug user     Current Outpatient Medications  Medication Sig Dispense Refill   valACYclovir (VALTREX) 1000 MG tablet Take 1 tablet (1,000 mg total) by mouth 2 (two) times daily. 30 tablet 3   HYDROcodone-acetaminophen (NORCO/VICODIN) 5-325 MG tablet Take 1 tablet by mouth every 6 (six) hours as needed for  moderate pain. (Patient not taking: Reported on 08/18/2019) 14 tablet 0   No current facility-administered medications for this visit.    No Known Allergies  Social History   Socioeconomic History   Marital status: Single    Spouse name: Not on file   Number of children: Not on file   Years of education: Not on file   Highest education level: Not on file  Occupational History   Not on file  Tobacco Use   Smoking status: Current Every Day Smoker    Packs/day: 1.00    Years: 12.00    Pack years: 12.00    Types: Cigarettes    Start date: 02/06/2003   Smokeless tobacco: Never Used   Tobacco comment: cutting back  Substance and Sexual Activity   Alcohol use: No    Alcohol/week: 0.0 standard drinks    Comment: rarely   Drug use: Yes    Types: Marijuana   Sexual activity: Not on file  Other Topics Concern   Not on file  Social History Narrative   Not on file   Social Determinants of Health   Financial Resource Strain:    Difficulty of Paying Living Expenses:   Food Insecurity:    Worried About Programme researcher, broadcasting/film/video in the Last Year:    Barista in the Last Year:   Transportation Needs:    Freight forwarder (Medical):    Lack of Transportation (Non-Medical):   Physical Activity:    Days of Exercise per Week:    Minutes of Exercise per Session:   Stress:    Feeling of  Stress :   Social Connections:    Frequency of Communication with Friends and Family:    Frequency of Social Gatherings with Friends and Family:    Attends Religious Services:    Active Member of Clubs or Organizations:    Attends Engineer, structural:    Marital Status:   Intimate Partner Violence:    Fear of Current or Ex-Partner:    Emotionally Abused:    Physically Abused:    Sexually Abused:     Review of Systems  Constitutional: Negative.  Negative for chills and fever.  HENT: Negative.  Negative for congestion and sore throat.     Respiratory: Negative.  Negative for cough and shortness of breath.   Cardiovascular: Negative.  Negative for chest pain and palpitations.  Gastrointestinal: Negative.  Negative for abdominal pain, nausea and vomiting.  Genitourinary: Negative.  Negative for dysuria and hematuria.  Skin: Positive for rash.  Neurological: Negative for dizziness and headaches.  All other systems reviewed and are negative.   Objective  Alert and oriented x3 in no apparent respiratory distress Vitals as reported by the patient: There were no vitals filed for this visit.  There are no diagnoses linked to this encounter. [place] was seen today for medication refill.  Diagnoses and all orders for this visit:  Herpes infection -     valACYclovir (VALTREX) 1000 MG tablet; Take 1 tablet (1,000 mg total) by mouth 2 (two) times daily.  History of herpes simplex infection     I discussed the assessment and treatment plan with the patient. The patient was provided an opportunity to ask questions and all were answered. The patient agreed with the plan and demonstrated an understanding of the instructions.   The patient was advised to call back or seek an in-person evaluation if the symptoms worsen or if the condition fails to improve as anticipated.  I provided 20 minutes of non-face-to-face time during this encounter.  Georgina Quint, MD  Primary Care at [facility]

## 2021-05-25 ENCOUNTER — Ambulatory Visit
Admission: RE | Admit: 2021-05-25 | Discharge: 2021-05-25 | Disposition: A | Discharge status: Home / Self Care | Payer: 59 | Source: Ambulatory Visit | Attending: Emergency Medicine | Admitting: Emergency Medicine

## 2021-05-25 VITALS — BP 103/56 | HR 66 | Temp 98.0°F

## 2021-05-25 DIAGNOSIS — K2100 Gastro-esophageal reflux disease with esophagitis, without bleeding: Secondary | ICD-10-CM | POA: Insufficient documentation

## 2021-05-25 DIAGNOSIS — R112 Nausea with vomiting, unspecified: Secondary | ICD-10-CM | POA: Diagnosis present

## 2021-05-25 DIAGNOSIS — J029 Acute pharyngitis, unspecified: Secondary | ICD-10-CM | POA: Insufficient documentation

## 2021-05-25 DIAGNOSIS — Z20818 Contact with and (suspected) exposure to other bacterial communicable diseases: Secondary | ICD-10-CM | POA: Insufficient documentation

## 2021-05-25 DIAGNOSIS — R197 Diarrhea, unspecified: Secondary | ICD-10-CM | POA: Insufficient documentation

## 2021-05-25 DIAGNOSIS — J302 Other seasonal allergic rhinitis: Secondary | ICD-10-CM | POA: Insufficient documentation

## 2021-05-25 LAB — POCT RAPID STREP A (OFFICE): Rapid Strep A Screen: NEGATIVE

## 2021-05-25 MED ORDER — CETIRIZINE HCL 10 MG PO TABS: 10.0000 mg | ORAL_TABLET | Freq: Every day | ORAL | 2 refills | Status: DC

## 2021-05-25 MED ORDER — FLUTICASONE PROPIONATE 50 MCG/ACT NA SUSP: 1.0000 | Freq: Every day | NASAL | 1 refills | Status: DC

## 2021-05-25 MED ORDER — LANSOPRAZOLE 30 MG PO CPDR: 30.0000 mg | DELAYED_RELEASE_CAPSULE | Freq: Every day | ORAL | 0 refills | Status: DC

## 2021-05-25 NOTE — ED Provider Notes (Signed)
?[facility] ? ? ? ?CSN: [id number] ?Arrival date & time: 05/25/21  1248 ?  ? ?HISTORY  ? ?Chief Complaint  ?Patient presents with  ? Sore Throat  ? Nasal Congestion  ? ?HPI ?Alden HippGrayson S Musil is a 36 y.o. male. Patient complains of a sore throat and a runny nose.  Patient states that 6 days ago, he began to have significantly increased heartburn, nausea, vomiting and diarrhea.  Patient states that the GI issues lasted 4 days in total and have since resolved but the heartburn remains.  Patient states he has had heartburn for years and typically takes something over-the-counter like Pepcid AC or Tums.  Patient states that these did not help but they really usually do not anyway.  Patient states he occasionally takes Benadryl for his seasonal allergies. ? ?The history is provided by the patient.  ?Past Medical History:  ?Diagnosis Date  ? IV drug user   ? ?Patient Active Problem List  ? Diagnosis Date Noted  ? History of herpes simplex infection 10/16/2016  ? Depression 10/16/2016  ? Medication refill 10/16/2016  ? Liver fibrosis 02/08/2015  ? Chronic hepatitis C without hepatic coma (HCC) 03/02/2014  ? 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 ?Past Surgical History:  ?Procedure Laterality Date  ? APPENDECTOMY    ? ? ?Home Medications   ? ?Prior to Admission medications   ?Medication Sig Start Date End Date Taking? Authorizing Provider  ?valACYclovir (VALTREX) 1000 MG tablet Take 1 tablet (1,000 mg total) by mouth 2 (two) times daily. 08/18/19   Georgina QuintSagardia, Miguel Jose, MD  ? ?Family History ?History reviewed. No pertinent family history. ?Social History ?Social History  ? ?Tobacco Use  ? Smoking status: Every Day  ?  Packs/day: 1.00  ?  Years: 12.00  ?  Pack years: 12.00  ?  Types: Cigarettes  ?  Start date: 02/06/2003  ? Smokeless tobacco: Never  ? Tobacco comments:  ?  cutting back  ?Substance Use Topics  ? Alcohol use: No  ?  Alcohol/week: 0.0 standard drinks  ?  Comment: rarely  ? Drug use: Yes   ?  Types: Marijuana  ? ?Allergies   ?Patient has no known allergies. ? ?Review of Systems ?Review of Systems ?Pertinent findings noted in history of present illness.  ? ?Physical Exam ?Triage Vital Signs ?ED Triage Vitals  ?Enc Vitals Group  ?   BP 12/02/20 0827 (!) 147/82  ?   Pulse Rate 12/02/20 0827 72  ?   Resp 12/02/20 0827 18  ?   Temp 12/02/20 0827 98.3 ?F (36.8 ?C)  ?   Temp Source 12/02/20 0827 Oral  ?   SpO2 12/02/20 0827 98 %  ?   Weight --   ?   Height --   ?   Head Circumference --   ?   Peak Flow --   ?   Pain Score 12/02/20 0826 5  ?   Pain Loc --   ?   Pain Edu? --   ?   Excl. in GC? --   ?No data found. ? ?Updated Vital Signs ?BP (!) 103/56 (BP Location: Right Arm)   Pulse 66   Temp 98 ?F (36.7 ?C) (Oral)   SpO2 95%  ? ?Physical Exam ?Vitals and nursing note reviewed.  ?Constitutional:   ?   General: He is not in acute distress. ?   Appearance: Normal appearance. He is not ill-appearing.  ?HENT:  ?   Head: Normocephalic  and atraumatic.  ?   Salivary Glands: Right salivary gland is not diffusely enlarged or tender. Left salivary gland is not diffusely enlarged or tender.  ?   Right Ear: Ear canal and external ear normal. No drainage. A middle ear effusion is present. There is no impacted cerumen. Tympanic membrane is bulging. Tympanic membrane is not injected or erythematous.  ?   Left Ear: Ear canal and external ear normal. No drainage. A middle ear effusion is present. There is no impacted cerumen. Tympanic membrane is bulging. Tympanic membrane is not injected or erythematous.  ?   Ears:  ?   Comments: Bilateral EACs normal, both TMs bulging with clear fluid ?   Nose: Rhinorrhea present. No nasal deformity, septal deviation, signs of injury, nasal tenderness, mucosal edema or congestion. Rhinorrhea is clear.  ?   Right Nostril: Occlusion present. No foreign body, epistaxis or septal hematoma.  ?   Left Nostril: Occlusion present. No foreign body, epistaxis or septal hematoma.  ?   Right  Turbinates: Enlarged, swollen and pale.  ?   Left Turbinates: Enlarged, swollen and pale.  ?   Right Sinus: No maxillary sinus tenderness or frontal sinus tenderness.  ?   Left Sinus: No maxillary sinus tenderness or frontal sinus tenderness.  ?   Mouth/Throat:  ?   Lips: Pink. No lesions.  ?   Mouth: Mucous membranes are moist. No oral lesions.  ?   Pharynx: Oropharynx is clear. Uvula midline. No posterior oropharyngeal erythema or uvula swelling.  ?   Tonsils: No tonsillar exudate. 0 on the right. 0 on the left.  ?   Comments: Postnasal drip ?Eyes:  ?   General: Lids are normal.     ?   Right eye: No discharge.     ?   Left eye: No discharge.  ?   Extraocular Movements: Extraocular movements intact.  ?   Conjunctiva/sclera: Conjunctivae normal.  ?   Right eye: Right conjunctiva is not injected.  ?   Left eye: Left conjunctiva is not injected.  ?Neck:  ?   Trachea: Trachea and phonation normal.  ?Cardiovascular:  ?   Rate and Rhythm: Normal rate and regular rhythm.  ?   Pulses: Normal pulses.  ?   Heart sounds: Normal heart sounds. No murmur heard. ?  No friction rub. No gallop.  ?Pulmonary:  ?   Effort: Pulmonary effort is normal. No accessory muscle usage, prolonged expiration or respiratory distress.  ?   Breath sounds: Normal breath sounds. No stridor, decreased air movement or transmitted upper airway sounds. No decreased breath sounds, wheezing, rhonchi or rales.  ?Chest:  ?   Chest wall: No tenderness.  ?Musculoskeletal:     ?   General: Normal range of motion.  ?   Cervical back: Normal range of motion and neck supple. Normal range of motion.  ?Lymphadenopathy:  ?   Cervical: No cervical adenopathy.  ?Skin: ?   General: Skin is warm and dry.  ?   Findings: No erythema or rash.  ?Neurological:  ?   General: No focal deficit present.  ?   Mental Status: He is alert and oriented to person, place, and time.  ?Psychiatric:     ?   Mood and Affect: Mood normal.     ?   Behavior: Behavior normal.  ? ? ?Visual  Acuity ?Right Eye Distance:   ?Left Eye Distance:   ?Bilateral Distance:   ? ?Right Eye Near:   ?Left  Eye Near:    ?Bilateral Near:    ? ?UC Couse / Diagnostics / Procedures:  ?  ?EKG ? ?Radiology ?No results found. ? ?Procedures ?Procedures (including critical care time) ? ?UC Diagnoses / Final Clinical Impressions(s)   ?I have reviewed the triage vital signs and the nursing notes. ? ?Pertinent labs & imaging results that were available during my care of the patient were reviewed by me and considered in my medical decision making (see chart for details).   ?Final diagnoses:  ?Exposure to strep throat  ?Gastroesophageal reflux disease with esophagitis without hemorrhage  ?Nausea vomiting and diarrhea  ?Acute pharyngitis, unspecified etiology  ?Seasonal allergic rhinitis, unspecified trigger  ? ?Patient advised to begin Prevacid for heartburn.  Patient provided with allergy medications.  Patient advised that if symptoms do not improve he needs to follow-up with either his primary care provider or follow-up the emergency room if symptoms worsen. ? ?ED Prescriptions   ? ? Medication Sig Dispense Auth. Provider  ? lansoprazole (PREVACID) 30 MG capsule Take 1 capsule (30 mg total) by mouth daily at 12 noon. 90 capsule Theadora Rama Scales, PA-C  ? cetirizine (ZYRTEC ALLERGY) 10 MG tablet Take 1 tablet (10 mg total) by mouth at bedtime. 30 tablet Theadora Rama Scales, PA-C  ? fluticasone (FLONASE) 50 MCG/ACT nasal spray Place 1 spray into both nostrils daily. Begin by using 2 sprays in each nare daily for 3 to 5 days, then decrease to 1 spray in each nare daily. 32 mL Theadora Rama Scales, PA-C  ? ?  ? ?PDMP not reviewed this encounter. ? ?Pending results:  ?Labs Reviewed  ?CULTURE, GROUP A STREP [facility])  ?POCT RAPID STREP A (OFFICE)  ? ? ?Medications Ordered in UC: ?Medications - No data to display ? ?Disposition Upon Discharge:  ?Condition: stable for discharge home ?Home: take medications as prescribed; routine  discharge instructions as discussed; follow up as advised. ? ?Patient presented with an acute illness with associated systemic symptoms and significant discomfort requiring urgent management. In my opinion, this is

## 2021-05-25 NOTE — Discharge Instructions (Signed)
Your rapid strep test today is negative.  We will perform throat culture which takes 3 to 5 days.  If that culture is positive, you will be notified and antibiotics will be provided for you. ? ?Because of your recent episode of severe heartburn, nausea, vomiting and diarrhea, I believe it is possible that you may have an ulcer and that the heartburn is also with the underlying cause of the soreness that you are been experiencing in your throat. ? ?I recommend that you begin taking Prevacid 30 mg capsules twice daily for the next 2 weeks then decrease to once daily and complete a full 31-month course.  I provided you with a 90-day prescription. ? ?For your seasonal allergies, I also recommend that you begin taking an antihistamine and a nasal steroid.  I have sent prescriptions for cetirizine and Flonase to [facility] for you. ? ?If your symptoms do not improve in the next 7 to 10 days, please follow-up with your primary care provider.  If your symptoms worsen, please report to the emergency room for further evaluation. ? ?Thank you for visiting urgent care today. ?

## 2021-05-25 NOTE — ED Triage Notes (Addendum)
Pt c/o a sore throat, and runny nose. ? ?He reports he had indigestion, and nausea that ended yesterday. ?

## 2021-05-27 LAB — CULTURE, GROUP A STREP (THRC)

## 2022-09-22 ENCOUNTER — Emergency Department (HOSPITAL_COMMUNITY)
Admission: EM | Admit: 2022-09-22 | Discharge: 2022-09-22 | Disposition: A | Discharge status: Home / Self Care | Payer: Self-pay | Attending: Emergency Medicine | Admitting: Emergency Medicine

## 2022-09-22 ENCOUNTER — Encounter (HOSPITAL_COMMUNITY): Payer: Self-pay

## 2022-09-22 ENCOUNTER — Emergency Department (HOSPITAL_COMMUNITY): Payer: Self-pay

## 2022-09-22 DIAGNOSIS — R1084 Generalized abdominal pain: Secondary | ICD-10-CM | POA: Insufficient documentation

## 2022-09-22 LAB — URINALYSIS, ROUTINE W REFLEX MICROSCOPIC
Bilirubin Urine: NEGATIVE
Glucose, UA: NEGATIVE mg/dL
Hgb urine dipstick: NEGATIVE
Ketones, ur: 5 mg/dL — AB
Leukocytes,Ua: NEGATIVE
Nitrite: NEGATIVE
Protein, ur: NEGATIVE mg/dL
Specific Gravity, Urine: 1.028 (ref 1.005–1.030)
pH: 5 (ref 5.0–8.0)

## 2022-09-22 LAB — COMPREHENSIVE METABOLIC PANEL
ALT: 19 U/L (ref 0–44)
AST: 19 U/L (ref 15–41)
Albumin: 3.4 g/dL — ABNORMAL LOW (ref 3.5–5.0)
Alkaline Phosphatase: 74 U/L (ref 38–126)
Anion gap: 8 (ref 5–15)
BUN: 18 mg/dL (ref 6–20)
CO2: 25 mmol/L (ref 22–32)
Calcium: 8.9 mg/dL (ref 8.9–10.3)
Chloride: 101 mmol/L (ref 98–111)
Creatinine, Ser: 0.79 mg/dL (ref 0.61–1.24)
GFR, Estimated: >60 mL/min (ref >60)
Glucose, Bld: 107 mg/dL — ABNORMAL HIGH (ref 70–99)
Potassium: 3.3 mmol/L — ABNORMAL LOW (ref 3.5–5.1)
Sodium: 134 mmol/L — ABNORMAL LOW (ref 135–145)
Total Bilirubin: 0.6 mg/dL (ref 0.3–1.2)
Total Protein: 7.4 g/dL (ref 6.5–8.1)

## 2022-09-22 LAB — CBC
HCT: 38.4 % — ABNORMAL LOW (ref 39.0–52.0)
Hemoglobin: 12 g/dL — ABNORMAL LOW (ref 13.0–17.0)
MCH: 25.9 pg — ABNORMAL LOW (ref 26.0–34.0)
MCHC: 31.3 g/dL (ref 30.0–36.0)
MCV: 82.9 fL (ref 80.0–100.0)
Platelets: 332 10*3/uL (ref 150–400)
RBC: 4.63 MIL/uL (ref 4.22–5.81)
RDW: 14.2 % (ref 11.5–15.5)
WBC: 9.5 10*3/uL (ref 4.0–10.5)
nRBC: 0 % (ref 0.0–0.2)

## 2022-09-22 LAB — LIPASE, BLOOD: Lipase: 19 U/L (ref 11–51)

## 2022-09-22 MED ORDER — POTASSIUM CHLORIDE CRYS ER 20 MEQ PO TBCR
40.0000 meq | EXTENDED_RELEASE_TABLET | Freq: Once | ORAL | Status: AC
  Administered 2022-09-22: 40 meq via ORAL
  Filled 2022-09-22: qty 2

## 2022-09-22 MED ORDER — SODIUM CHLORIDE 0.9 % IV BOLUS
1000.0000 mL | Freq: Once | INTRAVENOUS | Status: AC
  Administered 2022-09-22: 1000 mL via INTRAVENOUS

## 2022-09-22 MED ORDER — IOHEXOL 300 MG/ML  SOLN
100.0000 mL | Freq: Once | INTRAMUSCULAR | Status: AC | PRN
  Administered 2022-09-22: 100 mL via INTRAVENOUS

## 2022-09-22 MED ORDER — DICYCLOMINE HCL 10 MG PO CAPS
10.0000 mg | ORAL_CAPSULE | Freq: Once | ORAL | Status: AC
  Administered 2022-09-22: 10 mg via ORAL
  Filled 2022-09-22: qty 1

## 2022-09-22 MED ORDER — DICYCLOMINE HCL 20 MG PO TABS: 20.0000 mg | ORAL_TABLET | Freq: Two times a day (BID) | ORAL | 0 refills | Status: DC

## 2022-09-22 NOTE — ED Provider Notes (Signed)
[place] EMERGENCY DEPARTMENT AT [facility] Provider Note   CSN: [id number] Arrival date & time: 09/22/22  1347     History  Chief Complaint  Patient presents with   Abdominal Pain    Jermaine Hood is a 37 y.o. male.  37 year old male with prior medical history as detailed below presents for evaluation.  Patient reports ongoing GI issues times at least 3 months.  Patient reports intermittent episodes of nausea, vomiting, diarrhea, diffuse abdominal cramping.  Patient reports that he has not yet seen a medical provider for evaluation of same.  He denies associated fever.  He denies recent vomiting.  He reports that his diarrheal illness was worse over the last several days.  He decided to come to the ED for evaluation today.  Patient reports that he drinks alcohol rarely.  He reports approximately 1 beer per week.  Patient does report fairly frequent IV narcotic abuse.  He reports that he shoots up nearly daily.  Patient does not feel that his described GI symptoms are at all related to his narcotic abuse.  The history is provided by the patient and medical records.       Home Medications Prior to Admission medications   Medication Sig Start Date End Date Taking? Authorizing Provider  cetirizine (ZYRTEC ALLERGY) 10 MG tablet Take 1 tablet (10 mg total) by mouth at bedtime. 05/25/21 08/23/21  Theadora Rama Scales, PA-C  fluticasone (FLONASE) 50 MCG/ACT nasal spray Place 1 spray into both nostrils daily. Begin by using 2 sprays in each nare daily for 3 to 5 days, then decrease to 1 spray in each nare daily. 05/25/21   Theadora Rama Scales, PA-C  lansoprazole (PREVACID) 30 MG capsule Take 1 capsule (30 mg total) by mouth daily at 12 noon. 05/25/21 08/23/21  Theadora Rama Scales, PA-C  valACYclovir (VALTREX) 1000 MG tablet Take 1 tablet (1,000 mg total) by mouth 2 (two) times daily. 08/18/19   Georgina Quint, MD      Allergies    Patient has no known  allergies.    Review of Systems   Review of Systems  All other systems reviewed and are negative.   Physical Exam Updated Vital Signs BP 128/83 (BP Location: Right Arm)   Pulse 95   Temp 98.3 F (36.8 C) (Oral)   Resp 18   SpO2 100%  Physical Exam Vitals and nursing note reviewed.  Constitutional:      General: He is not in acute distress.    Appearance: Normal appearance. He is well-developed.  HENT:     Head: Normocephalic and atraumatic.  Eyes:     Conjunctiva/sclera: Conjunctivae normal.     Pupils: Pupils are equal, round, and reactive to light.  Cardiovascular:     Rate and Rhythm: Normal rate and regular rhythm.     Heart sounds: Normal heart sounds.  Pulmonary:     Effort: Pulmonary effort is normal. No respiratory distress.     Breath sounds: Normal breath sounds.  Abdominal:     General: There is no distension.     Palpations: Abdomen is soft.     Tenderness: There is no abdominal tenderness.  Musculoskeletal:        General: No deformity. Normal range of motion.     Cervical back: Normal range of motion and neck supple.  Skin:    General: Skin is warm and dry.  Neurological:     General: No focal deficit present.     Mental  Status: He is alert and oriented to person, place, and time.     ED Results / Procedures / Treatments   Labs (all labs ordered are listed, but only abnormal results are displayed) Labs Reviewed  URINALYSIS, ROUTINE W REFLEX MICROSCOPIC - Abnormal; Notable for the following components:      Result Value   Ketones, ur 5 (*)    All other components within normal limits  LIPASE, BLOOD  COMPREHENSIVE METABOLIC PANEL  CBC  RAPID URINE DRUG SCREEN, HOSP PERFORMED    EKG None  Radiology No results found.  Procedures Procedures    Medications Ordered in ED Medications  sodium chloride 0.9 % bolus 1,000 mL (1,000 mLs Intravenous New Bag/Given 09/22/22 1529)    ED Course/ Medical Decision Making/ A&P                                  Medical Decision Making Amount and/or Complexity of Data Reviewed Labs: ordered. Radiology: ordered.  Risk Prescription drug management.    Medical Screen Complete  This patient presented to the ED with complaint of abdominal pain, diarrhea, nausea.  This complaint involves an extensive number of treatment options. The initial differential diagnosis includes, but is not limited to, functional GI symptoms, IV drug abuse, metabolic abnormality, other intra-abdominal pathology, etc.  This presentation is: Acute, Chronic, Self-Limited, Previously Undiagnosed, Uncertain Prognosis, Complicated, Systemic Symptoms, and Threat to Life/Bodily Function  Patient with reported longstanding history of intermittent diarrhea, nausea, abdominal cramps.  Patient also complains of fairly regular IV injections of narcotics.  He denies prior workup for the symptoms.  Workup today is without evidence of significant acute pathology.  Patient is advised that close follow-up with PCP and GI is warranted.  Patient also advised that cessation of IV drug abuse is appropriate.  Patient is agreeable with plan to trial Bentyl for symptomatic relief.  Importance of close follow-up was stressed.  Strict return precautions given understood.  Additional history obtained:External records from outside sources obtained and reviewed including prior ED visits and prior Inpatient records.   Medicines ordered:  I ordered medication including bentyl  for abd pain  Reevaluation of the patient after these medicines showed that the patient: improved    Problem List / ED Course:  Abdominal pain, IVDA (Narcotics)   Reevaluation:  After the interventions noted above, I reevaluated the patient and found that they have: improved   Disposition:  After consideration of the diagnostic results and the patients response to treatment, I feel that the patent would benefit from close outpatient followup.           Final Clinical Impression(s) / ED Diagnoses Final diagnoses:  Generalized abdominal pain    Rx / DC Orders ED Discharge Orders          Ordered    dicyclomine (BENTYL) 20 MG tablet  2 times daily        09/22/22 1730              Wynetta Fines, MD 09/22/22 1733

## 2022-09-22 NOTE — ED Triage Notes (Signed)
Pt arrived via POV, c/o ongoing abd pain, n/v diarrhea for several weeks. Concerned for gallbladder issues.

## 2022-09-22 NOTE — Discharge Instructions (Signed)
Return for any problem.  ?

## 2022-10-05 ENCOUNTER — Emergency Department (HOSPITAL_COMMUNITY)
Admission: EM | Admit: 2022-10-05 | Discharge: 2022-10-05 | Disposition: A | Discharge status: Home / Self Care | Payer: Self-pay | Attending: Emergency Medicine | Admitting: Emergency Medicine

## 2022-10-05 ENCOUNTER — Other Ambulatory Visit: Payer: Self-pay

## 2022-10-05 DIAGNOSIS — R197 Diarrhea, unspecified: Secondary | ICD-10-CM | POA: Insufficient documentation

## 2022-10-05 DIAGNOSIS — Z1152 Encounter for screening for COVID-19: Secondary | ICD-10-CM | POA: Insufficient documentation

## 2022-10-05 DIAGNOSIS — E876 Hypokalemia: Secondary | ICD-10-CM | POA: Insufficient documentation

## 2022-10-05 DIAGNOSIS — R1013 Epigastric pain: Secondary | ICD-10-CM | POA: Insufficient documentation

## 2022-10-05 DIAGNOSIS — E860 Dehydration: Secondary | ICD-10-CM | POA: Insufficient documentation

## 2022-10-05 LAB — URINALYSIS, ROUTINE W REFLEX MICROSCOPIC
Glucose, UA: NEGATIVE mg/dL
Hgb urine dipstick: NEGATIVE
Ketones, ur: NEGATIVE mg/dL
Leukocytes,Ua: NEGATIVE
Nitrite: NEGATIVE
Protein, ur: NEGATIVE mg/dL
Specific Gravity, Urine: 1.03 (ref 1.005–1.030)
pH: 5 (ref 5.0–8.0)

## 2022-10-05 LAB — CBC
HCT: 34.2 % — ABNORMAL LOW (ref 39.0–52.0)
Hemoglobin: 10.8 g/dL — ABNORMAL LOW (ref 13.0–17.0)
MCH: 25.2 pg — ABNORMAL LOW (ref 26.0–34.0)
MCHC: 31.6 g/dL (ref 30.0–36.0)
MCV: 79.7 fL — ABNORMAL LOW (ref 80.0–100.0)
Platelets: 331 10*3/uL (ref 150–400)
RBC: 4.29 MIL/uL (ref 4.22–5.81)
RDW: 14.1 % (ref 11.5–15.5)
WBC: 9.3 10*3/uL (ref 4.0–10.5)
nRBC: 0 % (ref 0.0–0.2)

## 2022-10-05 LAB — COMPREHENSIVE METABOLIC PANEL
ALT: 36 U/L (ref 0–44)
AST: 35 U/L (ref 15–41)
Albumin: 2.7 g/dL — ABNORMAL LOW (ref 3.5–5.0)
Alkaline Phosphatase: 59 U/L (ref 38–126)
Anion gap: 9 (ref 5–15)
BUN: 15 mg/dL (ref 6–20)
CO2: 23 mmol/L (ref 22–32)
Calcium: 8.6 mg/dL — ABNORMAL LOW (ref 8.9–10.3)
Chloride: 104 mmol/L (ref 98–111)
Creatinine, Ser: 0.9 mg/dL (ref 0.61–1.24)
GFR, Estimated: >60 mL/min (ref >60)
Glucose, Bld: 103 mg/dL — ABNORMAL HIGH (ref 70–99)
Potassium: 3.1 mmol/L — ABNORMAL LOW (ref 3.5–5.1)
Sodium: 136 mmol/L (ref 135–145)
Total Bilirubin: 0.7 mg/dL (ref 0.3–1.2)
Total Protein: 6 g/dL — ABNORMAL LOW (ref 6.5–8.1)

## 2022-10-05 LAB — LIPASE, BLOOD: Lipase: 23 U/L (ref 11–51)

## 2022-10-05 LAB — SARS CORONAVIRUS 2 BY RT PCR: SARS Coronavirus 2 by RT PCR: NEGATIVE

## 2022-10-05 LAB — MAGNESIUM: Magnesium: 1.5 mg/dL — ABNORMAL LOW (ref 1.7–2.4)

## 2022-10-05 MED ORDER — MAGNESIUM OXIDE -MG SUPPLEMENT 400 (240 MG) MG PO TABS
400.0000 mg | ORAL_TABLET | Freq: Once | ORAL | Status: AC
  Administered 2022-10-05: 400 mg via ORAL
  Filled 2022-10-05: qty 1

## 2022-10-05 MED ORDER — ONDANSETRON HCL 4 MG/2ML IJ SOLN
4.0000 mg | Freq: Once | INTRAMUSCULAR | Status: AC
  Administered 2022-10-05: 4 mg via INTRAVENOUS
  Filled 2022-10-05: qty 2

## 2022-10-05 MED ORDER — LACTATED RINGERS IV BOLUS
1000.0000 mL | Freq: Once | INTRAVENOUS | Status: AC
  Administered 2022-10-05: 1000 mL via INTRAVENOUS

## 2022-10-05 MED ORDER — PANTOPRAZOLE SODIUM 40 MG IV SOLR
40.0000 mg | Freq: Once | INTRAVENOUS | Status: AC
  Administered 2022-10-05: 40 mg via INTRAVENOUS
  Filled 2022-10-05: qty 10

## 2022-10-05 MED ORDER — POTASSIUM CHLORIDE CRYS ER 20 MEQ PO TBCR
40.0000 meq | EXTENDED_RELEASE_TABLET | Freq: Once | ORAL | Status: AC
  Administered 2022-10-05: 40 meq via ORAL
  Filled 2022-10-05: qty 2

## 2022-10-05 MED ORDER — POTASSIUM CHLORIDE CRYS ER 20 MEQ PO TBCR: 20.0000 meq | EXTENDED_RELEASE_TABLET | Freq: Every day | ORAL | 0 refills | Status: DC

## 2022-10-05 NOTE — Discharge Instructions (Signed)
Thank you for letting us take care of you today.  Your liver, kidney, and pancreatic functions were normal.  Your potassium and magnesium levels were low.  This is likely related to the frequency of your diarrhea.  I am prescribing 5 days of potassium for you to take to help increase these levels.  Follow-up with your PCP to have them rechecked.  With your history of recurrent abdominal pain and diarrhea, I am referring you to GI.  They should call you within 72 hours to set up a follow-up appointment.  If you do not hear from them, call their office to schedule this follow-up.  For any new or worsening symptoms, please return to the nearest ED for reevaluation.

## 2022-10-05 NOTE — ED Triage Notes (Signed)
Pt arrives for eval of continued of generalized abdominal pain, ongoing for several months. States pain and bloating is worse after eating. States n/v but none since Tuesday and diarrhea for two weeks. Seen two weeks ago for same and given rx for Bentyl, which he states he's been taking without relief. Has not followed up with GI as advised at last visit.

## 2022-10-05 NOTE — ED Notes (Signed)
Pt given bedside commode for stool sample

## 2022-10-05 NOTE — ED Notes (Signed)
Pt attempted to provide stool sample, unable to at this time

## 2022-10-05 NOTE — ED Provider Notes (Signed)
[place] EMERGENCY DEPARTMENT AT [facility] Provider Note   CSN: [id number] Arrival date & time: 10/05/22  1009     History  Chief Complaint  Patient presents with   Abdominal Pain    Jermaine Hood is a 37 y.o. male who presents to the ED complaining of epigastric abdominal pain and diarrhea.  States that symptoms have been ongoing for 3 to 4 weeks now.  Notes that for 3 of these weeks he had diarrhea for 1 week via constipation.  He also has a history of the symptoms recurring chronically.  He is not currently seeing GI.  States that at previous visit he was prescribed Bentyl but is not helping.  He also takes medication for reflux intermittently at baseline but this is not helping, believes it is omeprazole of unknown dose.  He was initially having nausea and vomiting but this resolved 3 days ago.  No fevers or urinary symptoms.  States the epigastric pain radiates up into his throat and does feel similar to his reflux but is uncontrolled.  Notes he has approximately 10 episodes daily of loose, foul-smelling stools.  No known sick contacts.  At previous visit, it was noted that patient has frequent IV narcotic drug use but he denies any illicit substance use or alcohol use to me.  Also on chart review, it appears that patient recently had an urgent care visit at [facility] where he was prescribed Bactrim and stool studies were ordered.  He states that he never started the Bactrim and was unable to provide a stool sample at this visit.  No new medications.  No hematochezia or melena.  Previous CT scan unremarkable apart from constipation.  Patient states that symptoms feel the same.      Home Medications Prior to Admission medications   Medication Sig Start Date End Date Taking? Authorizing Provider  potassium chloride SA (KLOR-CON M) 20 MEQ tablet Take 1 tablet (20 mEq total) by mouth daily for 5 days. 10/05/22 10/10/22 Yes Jeniyah Menor L, PA-C  cetirizine (ZYRTEC ALLERGY) 10 MG  tablet Take 1 tablet (10 mg total) by mouth at bedtime. 05/25/21 08/23/21  Theadora Rama Scales, PA-C  dicyclomine (BENTYL) 20 MG tablet Take 1 tablet (20 mg total) by mouth 2 (two) times daily. 09/22/22   Wynetta Fines, MD  fluticasone (FLONASE) 50 MCG/ACT nasal spray Place 1 spray into both nostrils daily. Begin by using 2 sprays in each nare daily for 3 to 5 days, then decrease to 1 spray in each nare daily. 05/25/21   Theadora Rama Scales, PA-C  lansoprazole (PREVACID) 30 MG capsule Take 1 capsule (30 mg total) by mouth daily at 12 noon. 05/25/21 08/23/21  Theadora Rama Scales, PA-C  valACYclovir (VALTREX) 1000 MG tablet Take 1 tablet (1,000 mg total) by mouth 2 (two) times daily. 08/18/19   Georgina Quint, MD      Allergies    Patient has no known allergies.    Review of Systems   Review of Systems  All other systems reviewed and are negative.   Physical Exam Updated Vital Signs BP 115/71   Pulse 84   Temp 98.2 F (36.8 C) (Oral)   Resp 16   SpO2 98%  Physical Exam Vitals and nursing note reviewed.  Constitutional:      General: He is not in acute distress.    Appearance: Normal appearance. He is not ill-appearing or toxic-appearing.  HENT:     Head: Normocephalic and atraumatic.  Mouth/Throat:     Comments: Mildly dry mucus membranes Eyes:     Conjunctiva/sclera: Conjunctivae normal.  Cardiovascular:     Rate and Rhythm: Normal rate and regular rhythm.     Heart sounds: No murmur heard. Pulmonary:     Effort: Pulmonary effort is normal.     Breath sounds: Normal breath sounds.  Abdominal:     General: Abdomen is flat. There is no distension.     Palpations: Abdomen is soft. There is no pulsatile mass.     Tenderness: There is abdominal tenderness in the epigastric area. There is no right CVA tenderness, left CVA tenderness, guarding or rebound. Negative signs include Murphy's sign.  Musculoskeletal:        General: Normal range of motion.     Cervical  back: Neck supple.     Right lower leg: No edema.     Left lower leg: No edema.  Skin:    General: Skin is warm and dry.     Capillary Refill: Capillary refill takes less than 2 seconds.  Neurological:     General: No focal deficit present.     Mental Status: He is alert and oriented to person, place, and time. Mental status is at baseline.  Psychiatric:        Behavior: Behavior is cooperative.     Comments: Restless, anxious     ED Results / Procedures / Treatments   Labs (all labs ordered are listed, but only abnormal results are displayed) Labs Reviewed  COMPREHENSIVE METABOLIC PANEL - Abnormal; Notable for the following components:      Result Value   Potassium 3.1 (*)    Glucose, Bld 103 (*)    Calcium 8.6 (*)    Total Protein 6.0 (*)    Albumin 2.7 (*)    All other components within normal limits  CBC - Abnormal; Notable for the following components:   Hemoglobin 10.8 (*)    HCT 34.2 (*)    MCV 79.7 (*)    MCH 25.2 (*)    All other components within normal limits  URINALYSIS, ROUTINE W REFLEX MICROSCOPIC - Abnormal; Notable for the following components:   Color, Urine AMBER (*)    Bilirubin Urine SMALL (*)    All other components within normal limits  MAGNESIUM - Abnormal; Notable for the following components:   Magnesium 1.5 (*)    All other components within normal limits  SARS CORONAVIRUS 2 BY RT PCR  C DIFFICILE QUICK SCREEN W PCR REFLEX    GASTROINTESTINAL PANEL BY PCR, STOOL (REPLACES STOOL CULTURE)  LIPASE, BLOOD    EKG None  Radiology No results found.  Procedures Procedures    Medications Ordered in ED Medications  pantoprazole (PROTONIX) injection 40 mg (40 mg Intravenous Given 10/05/22 1207)  lactated ringers bolus 1,000 mL (0 mLs Intravenous Stopped 10/05/22 1430)  ondansetron (ZOFRAN) injection 4 mg (4 mg Intravenous Given 10/05/22 1208)  potassium chloride SA (KLOR-CON M) CR tablet 40 mEq (40 mEq Oral Given 10/05/22 1338)  magnesium oxide  (MAG-OX) tablet 400 mg (400 mg Oral Given 10/05/22 1338)    ED Course/ Medical Decision Making/ A&P Clinical Course as of 10/05/22 1603  Fri Oct 05, 2022  1307 Patient remains with severe abdominal pain.  States that since previous visit it is increasing in frequency and intensity and is very poorly controlled even with medications previously prescribed.  Thus far labs show some electrolyte disturbances but no other significant findings.  Patient about  to provide a stool sample.  Discussed risk/benefits of obtaining repeat imaging and patient would like to proceed with this given his worsening pain. [MG]  1424 Unfortunately, there is an extensive wait time for imaging at this time.  Rechecked patient, his pain has subsided.  He has not been able to provide a stool sample for testing.  At this time, he does not want to wait on CT scan or to provide stool sample.  Will refer him to GI for further evaluation.  Given strict ED return precautions and patient will return if needed. [MG]    Clinical Course User Index [MG] Tonette Lederer, PA-C                                 Medical Decision Making Amount and/or Complexity of Data Reviewed Labs: ordered. Decision-making details documented in ED Course. ECG/medicine tests: ordered. Decision-making details documented in ED Course.  Risk OTC drugs. Prescription drug management.   Medical Decision Making:   CHATO LAATSCH is a 37 y.o. male who presented to the ED today with abdominal pain detailed above.    Patient's presentation is complicated by their history of questionable drug use.  Complete initial physical exam performed, notably the patient  was in NAD, nontoxic appearing. Mildly dry mucus membranes.  He had moderate epigastric abdominal tenderness without rebound, guarding, or peritoneal signs.  No CVA tenderness.  He did appear very anxious but was cooperative. Reviewed and confirmed nursing documentation for past medical history, family  history, social history.    Initial Assessment:   With the patient's presentation of abdominal pain, differential diagnosis includes but is not limited to AAA, mesenteric ischemia, appendicitis, diverticulitis, DKA, gastritis, gastroenteritis, AMI, nephrolithiasis, pancreatitis, peritonitis, adrenal insufficiency, intestinal ischemia, constipation, UTI, SBO/LBO, splenic rupture, biliary disease, IBD, IBS, PUD, hepatitis, STD, ovarian/testicular torsion, electrolyte disturbance, DKA, dehydration, acute kidney injury, renal failure, cholecystitis, cholelithiasis, choledocholithiasis, abdominal pain of  unknown etiology.   Initial Plan:  Screening labs including CBC and Metabolic panel to evaluate for infectious or metabolic etiology of disease.  Lipase to evaluate for pancreatitis Urinalysis with reflex culture ordered to evaluate for UTI or relevant urologic/nephrologic pathology.  EKG to evaluate for cardiac pathology Symptomatic management Objective evaluation as reviewed   Initial Study Results:   Laboratory  All laboratory results reviewed without evidence of clinically relevant pathology.   Exceptions include: K3.1, magnesium 1.5, hemoglobin 10.8  EKG EKG was reviewed independently. ST segments without concerns for elevations.   EKG: normal sinus rhythm.   Radiology:  All images reviewed independently. Agree with radiology report at this time.   CT ABDOMEN PELVIS W CONTRAST  Result Date: 09/22/2022 CLINICAL DATA:  Abdominal pain. EXAM: CT ABDOMEN AND PELVIS WITH CONTRAST TECHNIQUE: Multidetector CT imaging of the abdomen and pelvis was performed using the standard protocol following bolus administration of intravenous contrast. RADIATION DOSE REDUCTION: This exam was performed according to the departmental dose-optimization program which includes automated exposure control, adjustment of the mA and/or kV according to patient size and/or use of iterative reconstruction technique.  CONTRAST:  OMNIPAQUE IOHEXOL 300 MG/ML  SOLN COMPARISON:  CT chest dated 06/26/2016 and abdominal ultrasound dated 04/07/2014. FINDINGS: Lower chest: Mild bilateral dependent atelectasis. Hepatobiliary: No focal liver abnormality is seen. No gallstones, gallbladder wall thickening, or biliary dilatation. Pancreas: Unremarkable. No pancreatic ductal dilatation or surrounding inflammatory changes. Spleen: Normal in size without focal abnormality. Adrenals/Urinary Tract: Adrenal  glands are unremarkable. Kidneys are normal, without renal calculi, focal lesion, or hydronephrosis. Bladder is unremarkable. Stomach/Bowel: Stomach is within normal limits. There is a moderate amount of stool throughout the colon. The appendix is not definitely identified, however no pericecal inflammatory changes are noted to suggest acute appendicitis. No evidence of bowel wall thickening, distention, or inflammatory changes. Vascular/Lymphatic: No significant vascular findings are present. No enlarged abdominal or pelvic lymph nodes. Reproductive: Prostate is unremarkable. Other: No abdominal wall hernia or abnormality. No abdominopelvic ascites. Musculoskeletal: No acute or significant osseous findings. IMPRESSION: No acute findings in the abdomen or pelvis. Moderate amount of stool throughout the colon. Electronically Signed   By: Romona Curls M.D.   On: 09/22/2022 17:15      Final Assessment and Plan:   37 year old male presents to the ED with epigastric abdominal pain and diarrhea.  Recently seen for the same.  Sent home with Bentyl, no relief with this.  Notes that he is still having frequent diarrhea.  Appears mildly dehydrated.  Work manage as above for further assessment.  He has tenderness in the epigastric abdomen but abdomen is soft, nondistended.  Workup shows a hypokalemia and hypomagnesemia which were repleted.  Patient noted greater than 10 bowel movements per day, we did order stool studies and patient unable to  provide sample.  States that diarrhea has significantly slowed.  Initially on first reevaluation, patient still with pain so discussed risk/benefits of ordering CT scan and he wanted to proceed with this.  On repeat reevaluation, patient states that pain has essentially resolved and he is feeling much more comfortable.  He would not like to proceed with CT scan any longer given improving pain and extended wait time which I do believe is reasonable.  No leukocytosis.  Somewhat chronically has the symptoms.  Will refer to GI for further evaluation at patient request.  Given strict ED return precautions, all questions answered, stable for discharge.   Clinical Impression:  1. Dehydration   2. Hypokalemia   3. Diarrhea, unspecified type   4. Epigastric pain   5. Hypomagnesemia      Discharge           Final Clinical Impression(s) / ED Diagnoses Final diagnoses:  Hypokalemia  Diarrhea, unspecified type  Epigastric pain  Dehydration  Hypomagnesemia    Rx / DC Orders ED Discharge Orders          Ordered    Ambulatory referral to Gastroenterology        10/05/22 1426    potassium chloride SA (KLOR-CON M) 20 MEQ tablet  Daily        10/05/22 1426              Tonette Lederer, PA-C 10/05/22 1606    Wynetta Fines, MD 10/05/22 [phone]

## 2022-10-09 ENCOUNTER — Ambulatory Visit: Payer: Self-pay | Admitting: Emergency Medicine

## 2022-10-11 ENCOUNTER — Encounter: Payer: Self-pay | Admitting: Emergency Medicine

## 2023-01-18 ENCOUNTER — Ambulatory Visit: Payer: Self-pay | Admitting: Gastroenterology

## 2023-02-16 ENCOUNTER — Emergency Department (HOSPITAL_COMMUNITY): Payer: Self-pay

## 2023-02-16 ENCOUNTER — Other Ambulatory Visit: Payer: Self-pay

## 2023-02-16 ENCOUNTER — Encounter (HOSPITAL_COMMUNITY): Payer: Self-pay

## 2023-02-16 ENCOUNTER — Emergency Department (HOSPITAL_COMMUNITY)
Admission: EM | Admit: 2023-02-16 | Discharge: 2023-02-16 | Disposition: A | Discharge status: Home / Self Care | Payer: Self-pay | Attending: Emergency Medicine | Admitting: Emergency Medicine

## 2023-02-16 DIAGNOSIS — S42022A Displaced fracture of shaft of left clavicle, initial encounter for closed fracture: Secondary | ICD-10-CM | POA: Insufficient documentation

## 2023-02-16 DIAGNOSIS — R079 Chest pain, unspecified: Secondary | ICD-10-CM | POA: Insufficient documentation

## 2023-02-16 DIAGNOSIS — T148XXA Other injury of unspecified body region, initial encounter: Secondary | ICD-10-CM

## 2023-02-16 DIAGNOSIS — W208XXA Other cause of strike by thrown, projected or falling object, initial encounter: Secondary | ICD-10-CM | POA: Insufficient documentation

## 2023-02-16 DIAGNOSIS — M79605 Pain in left leg: Secondary | ICD-10-CM | POA: Insufficient documentation

## 2023-02-16 DIAGNOSIS — M546 Pain in thoracic spine: Secondary | ICD-10-CM | POA: Insufficient documentation

## 2023-02-16 DIAGNOSIS — M542 Cervicalgia: Secondary | ICD-10-CM | POA: Insufficient documentation

## 2023-02-16 LAB — I-STAT CHEM 8, ED
BUN: 22 mg/dL — ABNORMAL HIGH (ref 6–20)
Calcium, Ion: 1.2 mmol/L (ref 1.15–1.40)
Chloride: 99 mmol/L (ref 98–111)
Creatinine, Ser: 1.1 mg/dL (ref 0.61–1.24)
Glucose, Bld: 81 mg/dL (ref 70–99)
HCT: 37 % — ABNORMAL LOW (ref 39.0–52.0)
Hemoglobin: 12.6 g/dL — ABNORMAL LOW (ref 13.0–17.0)
Potassium: 3.8 mmol/L (ref 3.5–5.1)
Sodium: 136 mmol/L (ref 135–145)
TCO2: 27 mmol/L (ref 22–32)

## 2023-02-16 MED ORDER — NALOXONE HCL 4 MG/0.1ML NA LIQD: 1.0000 | Freq: Once | NASAL | 0 refills | Status: AC

## 2023-02-16 MED ORDER — IOHEXOL 300 MG/ML  SOLN
75.0000 mL | Freq: Once | INTRAMUSCULAR | Status: AC | PRN
  Administered 2023-02-16: 75 mL via INTRAVENOUS

## 2023-02-16 MED ORDER — OXYCODONE-ACETAMINOPHEN 5-325 MG PO TABS
1.0000 | ORAL_TABLET | Freq: Once | ORAL | Status: AC
  Administered 2023-02-16: 1 via ORAL
  Filled 2023-02-16: qty 1

## 2023-02-16 MED ORDER — FENTANYL CITRATE PF 50 MCG/ML IJ SOSY
25.0000 ug | PREFILLED_SYRINGE | Freq: Once | INTRAMUSCULAR | Status: AC
  Administered 2023-02-16: 25 ug via INTRAVENOUS
  Filled 2023-02-16: qty 1

## 2023-02-16 MED ORDER — FENTANYL CITRATE PF 50 MCG/ML IJ SOSY: 50.0000 ug | PREFILLED_SYRINGE | Freq: Once | INTRAMUSCULAR | Status: DC

## 2023-02-16 MED ORDER — HYDROMORPHONE HCL 1 MG/ML IJ SOLN
1.0000 mg | Freq: Once | INTRAMUSCULAR | Status: AC
  Administered 2023-02-16: 1 mg via INTRAVENOUS

## 2023-02-16 MED ORDER — DIAZEPAM 5 MG PO TABS
5.0000 mg | ORAL_TABLET | Freq: Once | ORAL | Status: AC
  Administered 2023-02-16: 5 mg via ORAL
  Filled 2023-02-16: qty 1

## 2023-02-16 MED ORDER — OXYCODONE-ACETAMINOPHEN 5-325 MG PO TABS: 1.0000 | ORAL_TABLET | Freq: Four times a day (QID) | ORAL | 0 refills | Status: DC | PRN

## 2023-02-16 MED ORDER — HYDROMORPHONE HCL 1 MG/ML IJ SOLN
1.0000 mg | Freq: Once | INTRAMUSCULAR | Status: DC
  Filled 2023-02-16: qty 1
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
## 2023-03-25 DIAGNOSIS — S42002A Fracture of unspecified part of left clavicle, initial encounter for closed fracture: Secondary | ICD-10-CM | POA: Diagnosis not present

## 2023-07-02 DIAGNOSIS — F1120 Opioid dependence, uncomplicated: Secondary | ICD-10-CM | POA: Diagnosis not present

## 2023-07-09 DIAGNOSIS — F1120 Opioid dependence, uncomplicated: Secondary | ICD-10-CM | POA: Diagnosis not present

## 2023-07-16 DIAGNOSIS — F1120 Opioid dependence, uncomplicated: Secondary | ICD-10-CM | POA: Diagnosis not present

## 2023-07-23 DIAGNOSIS — F1120 Opioid dependence, uncomplicated: Secondary | ICD-10-CM | POA: Diagnosis not present

## 2023-07-29 ENCOUNTER — Ambulatory Visit (HOSPITAL_COMMUNITY)
Admission: EM | Admit: 2023-07-29 | Discharge: 2023-07-29 | Disposition: A | Discharge status: Other Acute Inpatient Hospital | Attending: Nurse Practitioner | Admitting: Nurse Practitioner

## 2023-07-29 ENCOUNTER — Other Ambulatory Visit: Payer: Self-pay

## 2023-07-29 ENCOUNTER — Emergency Department (HOSPITAL_COMMUNITY)
Admission: EM | Admit: 2023-07-29 | Discharge: 2023-07-30 | Disposition: A | Discharge status: Other Acute Inpatient Hospital | Payer: Self-pay | Attending: Emergency Medicine | Admitting: Emergency Medicine

## 2023-07-29 DIAGNOSIS — F29 Unspecified psychosis not due to a substance or known physiological condition: Secondary | ICD-10-CM | POA: Insufficient documentation

## 2023-07-29 DIAGNOSIS — Z046 Encounter for general psychiatric examination, requested by authority: Secondary | ICD-10-CM

## 2023-07-29 DIAGNOSIS — F1190 Opioid use, unspecified, uncomplicated: Secondary | ICD-10-CM

## 2023-07-29 DIAGNOSIS — F17200 Nicotine dependence, unspecified, uncomplicated: Secondary | ICD-10-CM | POA: Insufficient documentation

## 2023-07-29 DIAGNOSIS — R259 Unspecified abnormal involuntary movements: Secondary | ICD-10-CM | POA: Insufficient documentation

## 2023-07-29 DIAGNOSIS — F23 Brief psychotic disorder: Secondary | ICD-10-CM | POA: Insufficient documentation

## 2023-07-29 DIAGNOSIS — F22 Delusional disorders: Secondary | ICD-10-CM | POA: Insufficient documentation

## 2023-07-29 LAB — RAPID URINE DRUG SCREEN, HOSP PERFORMED
Amphetamines: POSITIVE — AB
Barbiturates: NOT DETECTED
Benzodiazepines: NOT DETECTED
Cocaine: NOT DETECTED
Opiates: NOT DETECTED
Tetrahydrocannabinol: POSITIVE — AB

## 2023-07-29 LAB — COMPREHENSIVE METABOLIC PANEL WITH GFR
ALT: 17 U/L (ref 0–44)
AST: 25 U/L (ref 15–41)
Albumin: 4.1 g/dL (ref 3.5–5.0)
Alkaline Phosphatase: 72 U/L (ref 38–126)
Anion gap: 9 (ref 5–15)
BUN: 17 mg/dL (ref 6–20)
CO2: 28 mmol/L (ref 22–32)
Calcium: 9.6 mg/dL (ref 8.9–10.3)
Chloride: 100 mmol/L (ref 98–111)
Creatinine, Ser: 0.91 mg/dL (ref 0.61–1.24)
GFR, Estimated: >60 mL/min (ref >60)
Glucose, Bld: 106 mg/dL — ABNORMAL HIGH (ref 70–99)
Potassium: 3.6 mmol/L (ref 3.5–5.1)
Sodium: 137 mmol/L (ref 135–145)
Total Bilirubin: 0.5 mg/dL (ref 0.0–1.2)
Total Protein: 7.9 g/dL (ref 6.5–8.1)

## 2023-07-29 LAB — CBC WITH DIFFERENTIAL/PLATELET
Abs Immature Granulocytes: 0.01 10*3/uL (ref 0.00–0.07)
Basophils Absolute: 0 10*3/uL (ref 0.0–0.1)
Basophils Relative: 1 %
Eosinophils Absolute: 0.2 10*3/uL (ref 0.0–0.5)
Eosinophils Relative: 3 %
HCT: 40.1 % (ref 39.0–52.0)
Hemoglobin: 12.9 g/dL — ABNORMAL LOW (ref 13.0–17.0)
Immature Granulocytes: 0 %
Lymphocytes Relative: 39 %
Lymphs Abs: 2.4 10*3/uL (ref 0.7–4.0)
MCH: 26.2 pg (ref 26.0–34.0)
MCHC: 32.2 g/dL (ref 30.0–36.0)
MCV: 81.5 fL (ref 80.0–100.0)
Monocytes Absolute: 0.3 10*3/uL (ref 0.1–1.0)
Monocytes Relative: 5 %
Neutro Abs: 3.3 10*3/uL (ref 1.7–7.7)
Neutrophils Relative %: 52 %
Platelets: 291 10*3/uL (ref 150–400)
RBC: 4.92 MIL/uL (ref 4.22–5.81)
RDW: 13.5 % (ref 11.5–15.5)
WBC: 6.3 10*3/uL (ref 4.0–10.5)
nRBC: 0 % (ref 0.0–0.2)

## 2023-07-29 LAB — SALICYLATE LEVEL: Salicylate Lvl: <7 mg/dL — ABNORMAL LOW (ref 7.0–30.0)

## 2023-07-29 LAB — ETHANOL: Alcohol, Ethyl (B): <15 mg/dL (ref <15)

## 2023-07-29 LAB — ACETAMINOPHEN LEVEL: Acetaminophen (Tylenol), Serum: <10 ug/mL — ABNORMAL LOW (ref 10–30)

## 2023-07-29 MED ORDER — LORAZEPAM 2 MG/ML IJ SOLN: 2.0000 mg | Freq: Three times a day (TID) | INTRAMUSCULAR | Status: DC | PRN

## 2023-07-29 MED ORDER — DIPHENHYDRAMINE HCL 50 MG/ML IJ SOLN: 50.0000 mg | Freq: Three times a day (TID) | INTRAMUSCULAR | Status: DC | PRN

## 2023-07-29 MED ORDER — HALOPERIDOL LACTATE 5 MG/ML IJ SOLN: 5.0000 mg | Freq: Three times a day (TID) | INTRAMUSCULAR | Status: DC | PRN

## 2023-07-29 MED ORDER — NICOTINE 21 MG/24HR TD PT24
21.0000 mg | MEDICATED_PATCH | Freq: Once | TRANSDERMAL | Status: AC
Start: 2023-07-29 — End: 2023-07-30
  Administered 2023-07-29: 21 mg via TRANSDERMAL
  Filled 2023-07-29: qty 1

## 2023-07-29 MED ORDER — DIPHENHYDRAMINE HCL 50 MG PO CAPS: 50.0000 mg | ORAL_CAPSULE | Freq: Three times a day (TID) | ORAL | Status: DC | PRN

## 2023-07-29 MED ORDER — HALOPERIDOL 5 MG PO TABS: 5.0000 mg | ORAL_TABLET | Freq: Three times a day (TID) | ORAL | Status: DC | PRN

## 2023-07-29 MED ORDER — HALOPERIDOL LACTATE 5 MG/ML IJ SOLN: 10.0000 mg | Freq: Three times a day (TID) | INTRAMUSCULAR | Status: DC | PRN

## 2023-07-29 MED ORDER — HYDROXYZINE HCL 25 MG PO TABS: 25.0000 mg | ORAL_TABLET | Freq: Three times a day (TID) | ORAL | Status: DC | PRN

## 2023-07-29 MED ORDER — ALUM & MAG HYDROXIDE-SIMETH 200-200-20 MG/5ML PO SUSP: 30.0000 mL | ORAL | Status: DC | PRN

## 2023-07-29 MED ORDER — STERILE WATER FOR INJECTION IJ SOLN
INTRAMUSCULAR | Status: AC
  Filled 2023-07-29: qty 10

## 2023-07-29 MED ORDER — MAGNESIUM HYDROXIDE 400 MG/5ML PO SUSP: 30.0000 mL | Freq: Every day | ORAL | Status: DC | PRN

## 2023-07-29 MED ORDER — TRAZODONE HCL 50 MG PO TABS: 50.0000 mg | ORAL_TABLET | Freq: Every evening | ORAL | Status: DC | PRN

## 2023-07-29 MED ORDER — ACETAMINOPHEN 325 MG PO TABS: 650.0000 mg | ORAL_TABLET | Freq: Four times a day (QID) | ORAL | Status: DC | PRN

## 2023-07-29 MED ORDER — ZIPRASIDONE MESYLATE 20 MG IM SOLR
20.0000 mg | Freq: Once | INTRAMUSCULAR | Status: AC | PRN
  Administered 2023-07-30: 20 mg via INTRAMUSCULAR
  Filled 2023-07-29 (×2): qty 20

## 2023-07-29 NOTE — ED Notes (Signed)
 I called lab to have bloodwork that was previously collected per nursing notes to see if they can get it started, however, lab states they could not locate any bloodwork. Pt refusing to be stuck again. Notified EDP.
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
## 2023-07-29 NOTE — Progress Notes (Signed)
 Inpatient Psychiatric Referral  Patient was recommended inpatient per Donia Snell, NP. There are no available beds at [facility], per [facility] Burnard Barter, RN. Patient was referred to the following out of network facilities: Destination  Service Provider Request Status Address Phone Fax  [facility]  Pending - Request Sent [address] [place] [address] [phone]  [facility]  Pending - Request Sent [address] [place] [address] [phone]  [facility]  Pending - Request Sent [address] [place] [address] [phone]  [facility]  Pending - Request Sent [address], [place] [address] [phone]  [facility]  Pending - Request Sent [address] [place] [address] [phone]  [facility]  Pending - Request Sent [address] [place] [address] [phone]  [facility]  Pending - Request Sent [address] [place] [address] [phone]  [facility]  Pending - Request Sent [address] [place] [phone]   Situation ongoing, CSW to continue following and update chart as more information becomes available.   Harrie Sofia MSW, LCSWA 07/29/2023  7:27PM
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
## 2023-07-29 NOTE — ED Notes (Signed)
 Patient transferred to [facility] for medical clearance. Patient escorted to back sallyport with staff for transport to destination. Safety maintained.

## 2023-07-29 NOTE — ED Notes (Signed)
 Pt was sent from [facility] to [facility] due to noncompliant with changing clothes and allowing blood work. On arrival pt calm and compliant, went to BR to change when asked, allowing Beverley to obtain blood. Pt will be wanded by security. At present time we are not having any problems with pt cooperating with staff.

## 2023-07-29 NOTE — ED Triage Notes (Signed)
 Pt states that he has had a rough 7 months. Pt states that he confided in a friend about his struggles and they interpreted what he was saying as suicidal. The pt denies any suicidal ideation and believes everything has been blown out of proportion. Pt is alert and oriented x4 and compliant.

## 2023-07-30 ENCOUNTER — Inpatient Hospital Stay (HOSPITAL_COMMUNITY)
Admission: AD | Admit: 2023-07-30 | Discharge: 2023-08-03 | DRG: 881 | Disposition: A | Discharge status: Home / Self Care | Source: Intra-hospital

## 2023-07-30 DIAGNOSIS — F17210 Nicotine dependence, cigarettes, uncomplicated: Secondary | ICD-10-CM | POA: Diagnosis not present

## 2023-07-30 DIAGNOSIS — F329 Major depressive disorder, single episode, unspecified: Secondary | ICD-10-CM | POA: Diagnosis not present

## 2023-07-30 DIAGNOSIS — F1120 Opioid dependence, uncomplicated: Secondary | ICD-10-CM | POA: Diagnosis present

## 2023-07-30 DIAGNOSIS — Z5941 Food insecurity: Secondary | ICD-10-CM | POA: Diagnosis not present

## 2023-07-30 DIAGNOSIS — F15159 Other stimulant abuse with stimulant-induced psychotic disorder, unspecified: Secondary | ICD-10-CM | POA: Diagnosis not present

## 2023-07-30 DIAGNOSIS — F333 Major depressive disorder, recurrent, severe with psychotic symptoms: Secondary | ICD-10-CM | POA: Diagnosis not present

## 2023-07-30 DIAGNOSIS — F15959 Other stimulant use, unspecified with stimulant-induced psychotic disorder, unspecified: Secondary | ICD-10-CM | POA: Insufficient documentation

## 2023-07-30 MED ORDER — STERILE WATER FOR INJECTION IJ SOLN
INTRAMUSCULAR | Status: AC
  Filled 2023-07-30: qty 10

## 2023-07-30 MED ORDER — OLANZAPINE 10 MG IM SOLR: 5.0000 mg | Freq: Three times a day (TID) | INTRAMUSCULAR | Status: DC | PRN

## 2023-07-30 MED ORDER — BENZTROPINE MESYLATE 0.5 MG PO TABS
0.5000 mg | ORAL_TABLET | Freq: Two times a day (BID) | ORAL | Status: DC
  Administered 2023-07-30 (×2): 0.5 mg via ORAL
  Filled 2023-07-30 (×2): qty 1

## 2023-07-30 MED ORDER — OLANZAPINE 5 MG PO TBDP: 5.0000 mg | ORAL_TABLET | Freq: Three times a day (TID) | ORAL | Status: DC | PRN

## 2023-07-30 MED ORDER — METHADONE HCL 10 MG/ML PO CONC
70.0000 mg | Freq: Every day | ORAL | Status: DC
  Administered 2023-07-30: 70 mg via ORAL
  Filled 2023-07-30: qty 10

## 2023-07-30 MED ORDER — ACETAMINOPHEN 325 MG PO TABS: 650.0000 mg | ORAL_TABLET | Freq: Four times a day (QID) | ORAL | Status: DC | PRN

## 2023-07-30 MED ORDER — MAGNESIUM HYDROXIDE 400 MG/5ML PO SUSP
30.0000 mL | Freq: Every day | ORAL | Status: DC | PRN
  Administered 2023-08-02: 30 mL via ORAL
  Filled 2023-07-30: qty 30

## 2023-07-30 MED ORDER — RISPERIDONE 1 MG PO TABS
1.0000 mg | ORAL_TABLET | Freq: Two times a day (BID) | ORAL | Status: DC
  Administered 2023-07-30 (×2): 1 mg via ORAL
  Filled 2023-07-30 (×2): qty 1

## 2023-07-30 MED ORDER — ALUM & MAG HYDROXIDE-SIMETH 200-200-20 MG/5ML PO SUSP: 30.0000 mL | ORAL | Status: DC | PRN

## 2023-07-30 MED ORDER — DIPHENHYDRAMINE HCL 50 MG/ML IJ SOLN: 50.0000 mg | Freq: Once | INTRAMUSCULAR | Status: DC

## 2023-07-30 MED ORDER — OLANZAPINE 10 MG IM SOLR: 10.0000 mg | Freq: Three times a day (TID) | INTRAMUSCULAR | Status: DC | PRN

## 2023-07-30 MED ORDER — DIPHENHYDRAMINE HCL 50 MG/ML IJ SOLN
50.0000 mg | Freq: Once | INTRAMUSCULAR | Status: AC
  Administered 2023-07-30: 50 mg via INTRAMUSCULAR
  Filled 2023-07-30: qty 1

## 2023-07-30 NOTE — Tx Team (Signed)
 Initial Treatment Plan 07/30/2023 11:17 PM AMI THORNSBERRY [id number]    PATIENT STRESSORS: Financial difficulties   Substance abuse     PATIENT STRENGTHS: General fund of knowledge  Motivation for treatment/growth    PATIENT IDENTIFIED PROBLEMS: Paranoia  Psychosis  SA( Meth, heroin, Adderall, Xanax )   Nothing                DISCHARGE CRITERIA:  Improved stabilization in mood, thinking, and/or behavior Motivation to continue treatment in a less acute level of care  PRELIMINARY DISCHARGE PLAN: Attend aftercare/continuing care group Outpatient therapy  PATIENT/FAMILY INVOLVEMENT: This treatment plan has been presented to and reviewed with the patient, DORSIE BURICH.  The patient and family have been given the opportunity to ask questions and make suggestions.  Orlando Ozell LABOR, RN 07/30/2023, 11:17 PM

## 2023-07-30 NOTE — ED Provider Notes (Signed)
 Emergency Medicine Observation Re-evaluation Note  LAURENCE CROFFORD is a 38 y.o. male, seen on rounds today.  Pt initially presented to the ED for complaints of Psychiatric Evaluation Currently, the patient is in room without complaint.  Physical Exam  BP 110/69 (BP Location: Left Arm)   Pulse 63   Temp (!) 97.5 F (36.4 C) (Oral)   Resp 18   Ht 5' 8 (1.727 m)   Wt 79 kg   SpO2 100%   BMI 26.48 kg/m  Physical Exam General: Awake, alert Cardiac: Normal rate Lungs: Normal effort Psych: Mood appropriate  ED Course / MDM  EKG:   I have reviewed the labs performed to date as well as medications administered while in observation.  Recent changes in the last 24 hours include no changes.  Plan  Current plan is for inpatient placement.    Mannie Pac T, DO 07/30/23 [phone]

## 2023-07-30 NOTE — ED Notes (Signed)
 Patient became increasingly agitated to the point of throwing objects. Medication given with the assistance of security.

## 2023-07-30 NOTE — ED Notes (Signed)
  Signed      Pt has been accepted to [facility] on 07/30/2023 Bed assignment: 306-2   Pt meets inpatient criteria per: Starlyn Patron NP    Attending Physician will be: Starleen Kitty MD   Report can be called to: Adult unit: [phone]   Pt can arrive after discharges    Care Team Notified: [facility] Cherylynn Ernst RN, Teddie Potters MD, Suzen Potts RN, Grayce Rocher RN   [place] LCSW-A    07/30/2023 3:03 PM

## 2023-07-30 NOTE — Progress Notes (Signed)
 Pt has been accepted to [facility] on 07/30/2023 Bed assignment: 306-2  Pt meets inpatient criteria per: Starlyn Patron NP   Attending Physician will be: Starleen Kitty MD  Report can be called to: Adult unit: [phone]  Pt can arrive after discharges   Care Team Notified: [facility] Cherylynn Ernst RN, Teddie Potters MD, Suzen Potts RN, Grayce Rocher RN  [place] Lavada Langsam LCSW-A   07/30/2023 3:03 PM

## 2023-07-30 NOTE — ED Notes (Signed)
 Gave pt dinner tray

## 2023-07-30 NOTE — ED Notes (Signed)
 Pt currently in custody of [employer] who are transporting the pt to [facility]

## 2023-07-30 NOTE — Consult Note (Signed)
  Patient gave permission to this provider to contact his girlfriend Rosaline [phone].  Rosaline reports that patient has a problem of substance abuse: uses Meth and Heroin and receives Methadone in outpatient clinic. Patient has been wanting to be sober but experiences difficulty controlling his urges. He has been experiencing delusional/paranoid thoughts as evidenced by his behaviors: Has been having difficulty to remain focused, has been believing that people are getting into the house. He has removed all the pictures from the walls, believing that the pictures are recording him. He was talking about buying a gun to kill the mechanical birds. Patient wants to quit using drugs but does not know how to, he keeps minimizing.  Rosaline states that I really don't think he should come home right now, he needs treatment for his psychosis and his drug use.
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
## 2023-07-30 NOTE — ED Notes (Addendum)
 Pt threw and destroyed deodorant container that he had in room
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
## 2023-07-30 NOTE — ED Notes (Signed)
 [place] police called for IVC transport to [facility] on [address]. Pt is currently lying behavior self controlled and willingly took his HS medication.

## 2023-07-30 NOTE — ED Notes (Signed)
Lunch tray received.

## 2023-07-31 ENCOUNTER — Other Ambulatory Visit: Payer: Self-pay

## 2023-07-31 ENCOUNTER — Encounter (HOSPITAL_COMMUNITY): Payer: Self-pay | Admitting: Psychiatry

## 2023-07-31 ENCOUNTER — Encounter (HOSPITAL_COMMUNITY): Payer: Self-pay

## 2023-07-31 DIAGNOSIS — F15959 Other stimulant use, unspecified with stimulant-induced psychotic disorder, unspecified: Secondary | ICD-10-CM | POA: Insufficient documentation

## 2023-07-31 DIAGNOSIS — F333 Major depressive disorder, recurrent, severe with psychotic symptoms: Secondary | ICD-10-CM | POA: Diagnosis not present

## 2023-07-31 MED ORDER — METHADONE HCL 10 MG PO TABS
70.0000 mg | ORAL_TABLET | Freq: Every day | ORAL | Status: DC
  Administered 2023-07-31 – 2023-08-03 (×4): 70 mg via ORAL
  Filled 2023-07-31 (×4): qty 7

## 2023-07-31 MED ORDER — RISPERIDONE 1 MG PO TABS
1.0000 mg | ORAL_TABLET | Freq: Every day | ORAL | Status: DC
  Administered 2023-07-31 – 2023-08-02 (×3): 1 mg via ORAL
  Filled 2023-07-31 (×3): qty 1

## 2023-07-31 MED ORDER — NICOTINE 14 MG/24HR TD PT24
14.0000 mg | MEDICATED_PATCH | Freq: Every day | TRANSDERMAL | Status: DC
  Administered 2023-07-31 – 2023-08-03 (×4): 14 mg via TRANSDERMAL
  Filled 2023-07-31 (×4): qty 1

## 2023-07-31 NOTE — Group Note (Signed)
 Recreation Therapy Group Note   Group Topic:Communication  Group Date: 07/31/2023 Start Time: 9065 End Time: 1008 Facilitators: Leo Fray-McCall, LRT,CTRS Location: [address] Dayroom   Group Topic: Communication, Problem Solving   Goal Area(s) Addresses:  Patient will effectively listen to complete activity.  Patient will identify communication skills used to make activity successful.  Patient will identify how skills used during activity can be used to reach post d/c goals.    Behavioral Response: Engaged   Intervention: Building surveyor Activity - Geometric pattern cards, pencils, blank paper    Activity: Geometric Drawings.  Three volunteers from the peer group will be shown an abstract picture with a particular arrangement of geometrical shapes.  Each round, one 'speaker' will describe the pattern, as accurately as possible without revealing the image to the group.  The remaining group members will listen and draw the picture to reflect how it is described to them. Patients with the role of 'listener' cannot ask clarifying questions but, may request that the speaker repeat a direction. Once the drawings are complete, the presenter will show the rest of the group the picture and compare how close each person came to drawing the picture. LRT will facilitate a post-activity discussion regarding effective communication and the importance of planning, listening, and asking for clarification in daily interactions with others.  Education: Environmental consultant, Active listening, Support systems, Discharge planning  Education Outcome: Acknowledges understanding/In group clarification offered/Needs additional education.    Affect/Mood: Appropriate   Participation Level: Engaged   Participation Quality: Independent   Behavior: Appropriate   Speech/Thought Process: Focused   Insight: Good   Judgement: Good   Modes of Intervention: Problem-solving   Patient Response to  Interventions:  Engaged   Education Outcome:  In group clarification offered    Clinical Observations/Individualized Feedback: Pt was bright and engaged. Pt was the second presenter. Pt was clear and repeated himself at times to make sure he gave the right instructions to peers.    Plan: Continue to engage patient in RT group sessions 2-3x/week.   Raihana Balderrama-McCall, LRT,CTRS 07/31/2023 12:31 PM

## 2023-07-31 NOTE — Group Note (Signed)
 Date:  07/31/2023 Time:  9:06 PM  Group Topic/Focus:  Narcotics Anonymous (NA) Meeting    Participation Level:  Active  Participation Quality:  Appropriate  Affect:  Appropriate  Cognitive:  Appropriate  Insight: Appropriate  Engagement in Group:  Engaged  Modes of Intervention:  Discussion, Socialization, and Support  Additional Comments:  Patient attended NA  Jermaine Hood 07/31/2023, 9:06 PM

## 2023-07-31 NOTE — Plan of Care (Signed)
   Problem: Activity: Goal: Sleeping patterns will improve Outcome: Progressing   Problem: Safety: Goal: Periods of time without injury will increase Outcome: Progressing
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
## 2023-07-31 NOTE — Progress Notes (Signed)
 Pt continues to be focused on his IVC status , writer explained the process , pt continued to stated his rights were being violated. Writer focused on I have the right to refuse medication writer tried to explain 2nd opinion and forced medication order for IVC patients. Pt encouraged to talk to the doctor and social worker tomorrow. Pt initially refused his medications, but pt took his HS medication with much encouragement.    07/31/23 2215  Psych Admission Type (Psych Patients Only)  Admission Status Involuntary  Psychosocial Assessment  Patient Complaints Suspiciousness;Worrying;Anxiety  Eye Contact Fair  Facial Expression Sad;Anxious;Angry  Affect Sad;Flat;Labile;Preoccupied  Speech Argumentative  Interaction Assertive  Motor Activity Slow  Appearance/Hygiene Unremarkable  Behavior Characteristics Agressive verbally;Irritable;Resistant to care  Mood Angry;Suspicious;Preoccupied  Aggressive Behavior  Effect No apparent injury  Thought Process  Coherency Circumstantial  Content Blaming others  Delusions Paranoid  Perception Hallucinations  Hallucination Auditory  Judgment Impaired  Confusion WDL  Danger to Self  Current suicidal ideation? Denies  Danger to Others  Danger to Others None reported or observed

## 2023-07-31 NOTE — Group Note (Unsigned)
 Date:  07/31/2023 Time:  9:29 AM  Group Topic/Focus:  Goals Group:   The focus of this group is to help patients establish daily goals to achieve during treatment and discuss how the patient can incorporate goal setting into their daily lives to aide in recovery.     Participation Level:  {BHH PARTICIPATION OZCZO:77735}  Participation Quality:  {BHH PARTICIPATION QUALITY:22265}  Affect:  {BHH AFFECT:22266}  Cognitive:  {BHH COGNITIVE:22267}  Insight: {BHH Insight2:20797}  Engagement in Group:  {BHH ENGAGEMENT IN HMNLE:77731}  Modes of Intervention:  {BHH MODES OF INTERVENTION:22269}  Additional Comments:  ***  Jermaine Hood Dawn 07/31/2023, 9:29 AM

## 2023-07-31 NOTE — Group Note (Signed)
 Date:  07/31/2023 Time:  0830am  Group Topic/Focus:  Goals Group:   The focus of this group is to help patients establish daily goals to achieve during treatment and discuss how the patient can incorporate goal setting into their daily lives to aide in recovery.    Participation Level:  Did Not Attend  Participation Quality:  Did not attend  Affect:    Cognitive:    Insight:   Engagement in Group:    Modes of Intervention:    Additional Comments:  Pt were aware of group time. Pt refused to attend.  Cassius LOISE Dawn 07/31/2023, 10:28 AM

## 2023-07-31 NOTE — [facility] Suicide Risk Assessment (Signed)
 Suicide Risk Assessment  Admission Assessment    [facility] Admission Suicide Risk Assessment   Nursing information obtained from:  Patient  Demographic factors:  Unemployed, Low socioeconomic status, Caucasian, Male  Current Mental Status:  NA  Loss Factors:  Financial problems / change in socioeconomic status  Historical Factors:  NA  Risk Reduction Factors:  NA  Total Time spent with patient: 1.5 hours including H&P.  Principal Problem: MDD (major depressive disorder)  Diagnosis:  Principal Problem:   MDD (major depressive disorder)  Subjective Data: See H&P.  Continued Clinical Symptoms:  Alcohol Use Disorder Identification Test Final Score (AUDIT): 2 The Alcohol Use Disorders Identification Test, Guidelines for Use in Primary Care, Second Edition.  World Science writer [facility]). Score between 0-7:  no or low risk or alcohol related problems. Score between 8-15:  moderate risk of alcohol related problems. Score between 16-19:  high risk of alcohol related problems. Score 20 or above:  warrants further diagnostic evaluation for alcohol dependence and treatment.  CLINICAL FACTORS:   Alcohol/Substance Abuse/Dependencies Unstable or Poor Therapeutic Relationship Previous Psychiatric Diagnoses and Treatments  Musculoskeletal: Strength & Muscle Tone: within normal limits Gait & Station: normal Patient leans: N/A  Psychiatric Specialty Exam:  Presentation  General Appearance:  Disheveled; Casual  Eye Contact: Fair  Speech: Clear and Coherent; Normal Rate  Speech Volume: Normal  Handedness: Right   Mood and Affect  Mood: -- (I geel disappointed.)  Affect: Congruent  Thought Process  Thought Processes: Coherent; Linear  Descriptions of Associations:Intact  Orientation:Full (Time, Place and Person)  Thought Content:Logical  History of Schizophrenia/Schizoaffective disorder: NA.  Duration of Psychotic Symptoms:  NA.  Hallucinations:Hallucinations: None  Ideas of Reference:None  Suicidal Thoughts:Suicidal Thoughts: No  Homicidal Thoughts:Homicidal Thoughts: No   Sensorium  Memory: Immediate Good; Recent Good; Remote Good  Judgment: Poor  Insight: Poor   Executive Functions  Concentration: Good  Attention Span: Good  Recall: Good  Fund of Knowledge: Fair  Language: Good   Psychomotor Activity  Psychomotor Activity:Psychomotor Activity: Normal   Assets  Assets: Communication Skills; Desire for Improvement; Housing; Resilience; Social Support   Sleep  Sleep:Sleep: Good Number of Hours of Sleep: 6.5   Physical/Ros Exam: See H&P.  Blood pressure (!) 97/58, pulse 77, temperature 98.1 F (36.7 C), temperature source Oral, resp. rate 16, height 5' 8 (1.727 m), weight 68.9 kg, SpO2 97%. Body mass index is 23.11 kg/m.  COGNITIVE FEATURES THAT CONTRIBUTE TO RISK:  Polarized thinking and Thought constriction (tunnel vision)    SUICIDE RISK:   Severe:  Frequent, intense, and enduring suicidal ideation, specific plan, no subjective intent, but some objective markers of intent (i.e., choice of lethal method), the method is accessible, some limited preparatory behavior, evidence of impaired self-control, severe dysphoria/symptomatology, multiple risk factors present, and few if any protective factors, particularly a lack of social support.  PLAN OF CARE: See H&P.  I certify that inpatient services furnished can reasonably be expected to improve the patient's condition.   Mac Bolster, NP, pmhnp, fnp-bc. 07/31/2023, 1:51 PM

## 2023-07-31 NOTE — BH IP Treatment Plan (Signed)
 Interdisciplinary Treatment and Diagnostic Plan Update  07/31/2023 Time of Session: 1040 Jermaine Hood MRN: [id number]  Principal Diagnosis: MDD (major depressive disorder)  Secondary Diagnoses: Principal Problem:   MDD (major depressive disorder)   Current Medications:  Current Facility-Administered Medications  Medication Dose Route Frequency Provider Last Rate Last Admin   acetaminophen  (TYLENOL ) tablet 650 mg  650 mg Oral Q6H PRN Trudy Carwin, NP       alum & mag hydroxide-simeth (MAALOX/MYLANTA) 200-200-20 MG/5ML suspension 30 mL  30 mL Oral Q4H PRN Trudy Carwin, NP       magnesium  hydroxide (MILK OF MAGNESIA) suspension 30 mL  30 mL Oral Daily PRN Trudy Carwin, NP       nicotine (NICODERM CQ - dosed in mg/24 hours) patch 14 mg  14 mg Transdermal Daily Trudy Carwin, NP   14 mg at 07/31/23 1015   OLANZapine (ZYPREXA) injection 10 mg  10 mg Intramuscular TID PRN Trudy Carwin, NP       OLANZapine (ZYPREXA) injection 5 mg  5 mg Intramuscular TID PRN Trudy Carwin, NP       OLANZapine zydis (ZYPREXA) disintegrating tablet 5 mg  5 mg Oral TID PRN Trudy Carwin, NP       PTA Medications: Medications Prior to Admission  Medication Sig Dispense Refill Last Dose/Taking   methadone (DOLOPHINE) 10 MG/ML solution Take 70 mg by mouth daily.       Patient Stressors: Financial difficulties   Substance abuse    Patient Strengths: Automotive engineer for treatment/growth   Treatment Modalities: Medication Management, Group therapy, Case management,  1 to 1 session with clinician, Psychoeducation, Recreational therapy.   Physician Treatment Plan for Primary Diagnosis: MDD (major depressive disorder) Long Term Goal(s): Improvement in symptoms so as ready for discharge   Short Term Goals: Ability to identify and develop effective coping behaviors will improve Ability to maintain clinical measurements within normal limits will improve Compliance with prescribed  medications will improve Ability to identify triggers associated with substance abuse/mental health issues will improve Ability to identify changes in lifestyle to reduce recurrence of condition will improve Ability to verbalize feelings will improve Ability to disclose and discuss suicidal ideas Ability to demonstrate self-control will improve  Medication Management: Evaluate patient's response, side effects, and tolerance of medication regimen.  Therapeutic Interventions: 1 to 1 sessions, Unit Group sessions and Medication administration.  Evaluation of Outcomes: Not Progressing  Physician Treatment Plan for Secondary Diagnosis: Principal Problem:   MDD (major depressive disorder)  Long Term Goal(s): Improvement in symptoms so as ready for discharge   Short Term Goals: Ability to identify and develop effective coping behaviors will improve Ability to maintain clinical measurements within normal limits will improve Compliance with prescribed medications will improve Ability to identify triggers associated with substance abuse/mental health issues will improve Ability to identify changes in lifestyle to reduce recurrence of condition will improve Ability to verbalize feelings will improve Ability to disclose and discuss suicidal ideas Ability to demonstrate self-control will improve     Medication Management: Evaluate patient's response, side effects, and tolerance of medication regimen.  Therapeutic Interventions: 1 to 1 sessions, Unit Group sessions and Medication administration.  Evaluation of Outcomes: Not Progressing   RN Treatment Plan for Primary Diagnosis: MDD (major depressive disorder) Long Term Goal(s): Knowledge of disease and therapeutic regimen to maintain health will improve  Short Term Goals: Ability to remain free from injury will improve, Ability to verbalize frustration and anger appropriately will  improve, Ability to demonstrate self-control, Ability to  participate in decision making will improve, Ability to verbalize feelings will improve, Ability to disclose and discuss suicidal ideas, Ability to identify and develop effective coping behaviors will improve, and Compliance with prescribed medications will improve  Medication Management: RN will administer medications as ordered by provider, will assess and evaluate patient's response and provide education to patient for prescribed medication. RN will report any adverse and/or side effects to prescribing provider.  Therapeutic Interventions: 1 on 1 counseling sessions, Psychoeducation, Medication administration, Evaluate responses to treatment, Monitor vital signs and CBGs as ordered, Perform/monitor CIWA, COWS, AIMS and Fall Risk screenings as ordered, Perform wound care treatments as ordered.  Evaluation of Outcomes: Not Progressing   LCSW Treatment Plan for Primary Diagnosis: MDD (major depressive disorder) Long Term Goal(s): Safe transition to appropriate next level of care at discharge, Engage patient in therapeutic group addressing interpersonal concerns.  Short Term Goals: Engage patient in aftercare planning with referrals and resources, Increase social support, Increase ability to appropriately verbalize feelings, Increase emotional regulation, Facilitate acceptance of mental health diagnosis and concerns, Facilitate patient progression through stages of change regarding substance use diagnoses and concerns, Identify triggers associated with mental health/substance abuse issues, and Increase skills for wellness and recovery  Therapeutic Interventions: Assess for all discharge needs, 1 to 1 time with Social worker, Explore available resources and support systems, Assess for adequacy in community support network, Educate family and significant other(s) on suicide prevention, Complete Psychosocial Assessment, Interpersonal group therapy.  Evaluation of Outcomes: Not Progressing   Progress  in Treatment: Attending groups: No. Participating in groups: No. Taking medication as prescribed: Yes. Toleration medication: Yes. Family/Significant other contact made: No, will contact:  consents pending Patient understands diagnosis: Yes. Discussing patient identified problems/goals with staff: Yes. Medical problems stabilized or resolved: Yes. Denies suicidal/homicidal ideation: Yes. Issues/concerns per patient self-inventory: No.   New problem(s) identified: No, Describe:  none  New Short Term/Long Term Goal(s): medication stabilization, elimination of SI thoughts, development of comprehensive mental wellness plan.    Patient Goals:  I need coping skills for not using drugs  Discharge Plan or Barriers: Patient recently admitted. CSW will continue to follow and assess for appropriate referrals and possible discharge planning.    Reason for Continuation of Hospitalization: Depression Hallucinations Medication stabilization Other; describe paranoia  Estimated Length of Stay: 5-7 days  Last 3 [place] Suicide Severity Risk Score: Flowsheet Row Ad[facility] (Current) [facility] from 07/29/2023 in [facility] ED from [facility]/16/2023 in [facility]  C-SSRS RISK CATEGORY No Risk No Risk No Risk    Last PHQ 2/9 Scores:    08/18/2019    4:57 PM 05/05/2018    1:36 PM 06/26/2016    9:15 AM  Depression screen PHQ 2/9  Decreased Interest 0 0   Down, Depressed, Hopeless 0 0 0  PHQ - 2 Score 0 0 0    Scribe for Treatment Team: Jenkins LULLA Primer, LCSWA 07/31/2023 11:18 AM

## 2023-07-31 NOTE — Plan of Care (Signed)
  Problem: Activity: Goal: Sleeping patterns will improve Outcome: Progressing   Problem: Safety: Goal: Ability to remain free from injury will improve Outcome: Progressing   Problem: Education: Goal: Mental status will improve Outcome: Not Progressing

## 2023-07-31 NOTE — Plan of Care (Signed)
  Problem: Education: Goal: Emotional status will improve Outcome: Progressing Goal: Verbalization of understanding the information provided will improve Outcome: Progressing   Problem: Activity: Goal: Sleeping patterns will improve Outcome: Progressing   Problem: Health Behavior/Discharge Planning: Goal: Compliance with treatment plan for underlying cause of condition will improve Outcome: Progressing   Problem: Activity: Goal: Will verbalize the importance of balancing activity with adequate rest periods Outcome: Progressing

## 2023-07-31 NOTE — Progress Notes (Signed)
   07/31/23 1000  Psych Admission Type (Psych Patients Only)  Admission Status Involuntary  Psychosocial Assessment  Patient Complaints Anxiety;Suspiciousness  Location manager;Watchful;Suspiciousness  Facial Expression Anxious;Pensive  Affect Sad;Flat  Speech Soft;Rapid  Interaction Assertive  Motor Activity Other (Comment) (WNL)  Appearance/Hygiene Unremarkable  Behavior Characteristics Cooperative  Mood Suspicious;Sad;Preoccupied  Thought Process  Coherency Circumstantial  Content Delusions;Paranoia  Delusions Paranoid  Perception WDL  Hallucination None reported or observed  Judgment Limited  Confusion None  Danger to Self  Current suicidal ideation? Denies  Danger to Others  Danger to Others None reported or observed

## 2023-07-31 NOTE — Progress Notes (Signed)
 Pt continues to be paranoid about the police and everyone he comes in contact with(especially at the hospital) being in cahoots with the police.pt continues to endorse that there is nothing wrong with me, I don't need any medication
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
## 2023-08-01 DIAGNOSIS — F333 Major depressive disorder, recurrent, severe with psychotic symptoms: Secondary | ICD-10-CM | POA: Diagnosis not present

## 2023-08-01 NOTE — Progress Notes (Signed)
   08/01/23 2015  Psych Admission Type (Psych Patients Only)  Admission Status Involuntary  Psychosocial Assessment  Patient Complaints Depression;Substance abuse  Eye Contact Fair  Facial Expression Sad;Anxious;Angry  Affect Sad;Flat;Labile;Preoccupied  Speech Argumentative  Interaction Assertive  Motor Activity Slow  Appearance/Hygiene Unremarkable  Behavior Characteristics Cooperative  Mood Anxious;Labile  Aggressive Behavior  Effect No apparent injury  Thought Process  Coherency Circumstantial  Content Blaming others  Delusions Paranoid  Perception Hallucinations  Hallucination Auditory  Judgment Impaired  Confusion WDL  Danger to Self  Current suicidal ideation? Denies  Danger to Others  Danger to Others None reported or observed

## 2023-08-01 NOTE — Group Note (Signed)
 Date:  08/01/2023 Time:  9:08 PM  Group Topic/Focus:  Wrap-Up Group:   The focus of this group is to help patients review their daily goal of treatment and discuss progress on daily workbooks.    Participation Level:  Active  Participation Quality:  Appropriate and Attentive  Affect:  Blunted and Irritable  Cognitive:  Alert and Appropriate  Insight: Lacking  Engagement in Group:  Engaged  Modes of Intervention:  Discussion and Education  Additional Comments:  Pt attended and participated in wrap up group this evening but had no rating for the day. Pt stated that they were able to speak with the SW and the NP, but is unhappy that they were not able to speak with the Dr. [name] to be suspicious about staff leaving them out of the decisions being made about their care, such as IVC process, power of attorney, and possible court dates. Pt is requesting to speak with their treatment team about these concerns.   Jermaine Hood 08/01/2023, 9:08 PM
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
## 2023-08-01 NOTE — Group Note (Signed)
 LCSW Group Therapy Note   Group Date: 08/01/2023 Start Time: 1100 End Time: 1200   Participation:  patient was present  Type of Therapy:  Group Therapy  Topic:  Healing Hearts: A Safe Space for Grief  Objective:  The objective of this group, Healing Hearts: A Safe Space for Grief, is to create a compassionate environment where participants can process their grief, explore different stages of grief, and discover ways to honor their loved ones through personal rituals.  3 Goals: Provide a safe and supportive space where participants feel comfortable sharing their feelings and experiences of grief without judgment. Educate participants about the stages of grief and emphasize that there is no right way to grieve or a fixed timeline for healing. Introduce the concept of rituals as a means to process grief, allowing individuals to honor their loved ones in a personal and meaningful way.  Summary:  In Healing Hearts: A Safe Space for Grief, we explored the unique and personal journey of grief, emphasizing that everyone experiences it differently. We discussed the 5 stages of grief (denial, anger, bargaining, depression, and acceptance), with the understanding that grief is not linear. Rituals were introduced as a way to help cope with loss, offering comfort and connection through meaningful actions such as lighting candles or taking memory walks. Participants were encouraged to express their emotions, focus on self-care, and reflect on moments of gratitude for their loved ones, recognizing that healing is a process and there is no timeline for grief.  Therapeutic Modalities: Elements of CBT, Cognitive Reframing: challenge negative thoughts (e.g., guilt, self-blame) by identifying and replacing them with healthier perspectives. Normalizing Grief: Educate that grief doesn't have a fixed timeline and everyone experiences it differently).  DBT Techniques:  Radical Acceptance: Accept the pain of loss  without judgment, acknowledging that grief is a natural and necessary process.   Group Support: Foster peer sharing, normalizing diverse grief experiences in a safe, supportive environment.   Appollonia Klee O Kharson Rasmusson, LCSWA 08/01/2023  5:35 PM

## 2023-08-01 NOTE — Progress Notes (Signed)
 The lobby receptionist contacted the writer immediately after visiting hours, informing that the patient's significant other (SO) wanted to speak to Retail banker. Before talking to the visitor the writer went to the unit to get information from staff. Writer was informed that the SO wanted to be able to discuss the patient's care, however she wasn't on the list. Staff informed that they had patient add his SO's name prior to Clinical research associate addressing. After Loss adjuster, chartered spoke to the SO in person while in the lobby. SO concerned stating, the patient said he, hasn't seen his provider. Also stated that patient appeared to be drowsy and reported he was sleeping a lot since starting his antipsychotic. She informed Clinical research associate that he's taking 1mg  of risperdal .SO asked, do you think the doctors would say lamictal is better for him than risperdal . Or maybe he can take 0.5mg  instead. Writer informed SO the patient has seen a provider each day since his admission. Writer informed that sleepiness and drowsiness may decrease after being on the medication for several weeks. SO repeated her concerns about switching to lamictal and if doctor think it may be better. Writer informed that doctors are aware of the many medications for patient's disease, and Clinical research associate chooses to believe he was put on the medication the providers felt would better serve him. However, informed that writer would document her concerns. She also stated, he may not continue to take his meds when discharged because he doesn't believe he's psychotic. SO inquired about an injectable. SO gave her name and telephone number and asked that the patient's nurse contact her. Stated she wants to advocate for the patient's care. SO is Rosaline Ester [phone]. SO Cytogeneticist after conversation.

## 2023-08-01 NOTE — Group Note (Signed)
 Occupational Therapy Group Note  Group Topic: Sleep Hygiene  Group Date: 08/01/2023 Start Time: 1400 End Time: 1430 Facilitators: Dot [place] MATSU, OT   Group Description: Group encouraged increased participation and engagement through topic focused on sleep hygiene. Patients reflected on the quality of sleep they typically receive and identified areas that need improvement. Group was given background information on sleep and sleep hygiene, including common sleep disorders. Group members also received information on how to improve one's sleep and introduced a sleep diary as a tool that can be utilized to track sleep quality over a length of time. Group session ended with patients identifying one or more strategies they could utilize or implement into their sleep routine in order to improve overall sleep quality.        Therapeutic Goal(s):  Identify one or more strategies to improve overall sleep hygiene  Identify one or more areas of sleep that are negatively impacted (sleep too much, too little, etc)     Participation Level: Engaged   Participation Quality: Independent   Behavior: Appropriate   Speech/Thought Process: Relevant   Affect/Mood: Appropriate   Insight: Fair   Judgement: Fair      Modes of Intervention: Education  Patient Response to Interventions:  Attentive   Plan: Continue to engage patient in OT groups 2 - 3x/week.  08/01/2023  [place] MATSU Dot, OT   Jermaine Hood, OT

## 2023-08-01 NOTE — Plan of Care (Signed)
 Nurse discussed anxiety, depression and coping skills with patient.

## 2023-08-01 NOTE — Progress Notes (Signed)
 [facility] MD Progress Note  08/01/2023 9:59 AM Jermaine Hood  MRN:  [id number]  Reason for admission: 38 y.o. Caucasian male with reported hx of opioid use disorder. Was brought to the [facility] accompanied by the law enforcement agents with complaints of worsening psychosis/paranoid ideations. Patient was IVC'ed by his father Jermaine Hood).   Daily notes: Jermaine Hood is seen this morning. Chart reviewed. The chart findings discussed with the treatment team. He presents alert, oriented & aware of situation. He is visible on the unit, attending group sessions. He presents with a flat, but an improving affect. He reports, I'm still feeling good. My mood is good. I'm sleeping too much here. The sleep medicine made me too sleepy last night. I have been involved & doing everything that I needed to do here such as attending the group sessions. I have some concerns about being here. I need to know when I can get discharged. This time is just a bad time for me to be in the hospital because I got thing plans prior to all of these. And because I was IVC'ed to be here, I would not want to miss the court date because I have to also know what decisions are being made on my life. I feel like I need to be a part of the meeting that is being held about me & my well being. My father cannot continue to do this to me. Other than this, I'm fine. I would also like to see my doctor. Jermaine Hood currently denies any SIHI, AVH, delusional thoughts or paranoia. He does not appear to be responding to any internal stimuli. Patient is encouraged to continue to take his medications as recommended. He is instructed & encouraged to be patient as his body is trying to get use to his new medication Risperdal. Reviewed current lab results, no changes. The new lab orders are yet to be drawn. Vital signs remain stable. There are no changes made on the current plan of care. Continue as already in progress.  Principal Problem: MDD (major depressive  disorder)  Diagnosis: Principal Problem:   MDD (major depressive disorder) Active Problems:   Stimulant-induced psychotic disorder (HCC)  Total Time spent with patient: 45.  Past Psychiatric History: See H&P.  Past Medical History:  Past Medical History:  Diagnosis Date   IV drug user     Past Surgical History:  Procedure Laterality Date   APPENDECTOMY     Family History: History reviewed. No pertinent family history.  Family Psychiatric  History: See H&P.  Social History:  Social History   Substance and Sexual Activity  Alcohol Use Yes   Comment: rarely     Social History   Substance and Sexual Activity  Drug Use Yes   Types: Marijuana, Methamphetamines, Fentanyl , Heroin    Social History   Socioeconomic History   Marital status: Single    Spouse name: Not on file   Number of children: Not on file   Years of education: Not on file   Highest education level: Not on file  Occupational History   Not on file  Tobacco Use   Smoking status: Every Day    Current packs/day: 1.00    Average packs/day: 1 pack/day for 20.5 years (20.5 ttl pk-yrs)    Types: Cigarettes    Start date: 02/06/2003   Smokeless tobacco: Never   Tobacco comments:    cutting back  Substance and Sexual Activity   Alcohol use: Yes    Comment: rarely  Drug use: Yes    Types: Marijuana, Methamphetamines, Fentanyl , Heroin   Sexual activity: Yes    Birth control/protection: Condom  Other Topics Concern   Not on file  Social History Narrative   Not on file   Social Drivers of Health   Financial Resource Strain: Not on file  Food Insecurity: Food Insecurity Present (07/30/2023)   Hunger Vital Sign    Worried About Running Out of Food in the Last Year: Never true    Ran Out of Food in the Last Year: Sometimes true  Transportation Needs: No Transportation Needs (07/30/2023)   PRAPARE - Administrator, Civil Service (Medical): No    Lack of Transportation (Non-Medical): No   Physical Activity: Not on file  Stress: Not on file  Social Connections: Unknown (11/09/2022)   Received from [facility]   Social Network    Social Network: Not on file   Additional Social History:   Sleep: Good Estimated Sleeping Duration (Last 24 Hours): 6.00-6.75 hours  Appetite:  Good  Current Medications: Current Facility-Administered Medications  Medication Dose Route Frequency Provider Last Rate Last Admin   acetaminophen  (TYLENOL ) tablet 650 mg  650 mg Oral Q6H PRN Trudy Carwin, NP       alum & mag hydroxide-simeth (MAALOX/MYLANTA) 200-200-20 MG/5ML suspension 30 mL  30 mL Oral Q4H PRN Trudy Carwin, NP       magnesium  hydroxide (MILK OF MAGNESIA) suspension 30 mL  30 mL Oral Daily PRN Trudy Carwin, NP       methadone (DOLOPHINE) tablet 70 mg  70 mg Oral Daily Keanthony Poole I, NP   70 mg at 07/31/23 1800   nicotine (NICODERM CQ - dosed in mg/24 hours) patch 14 mg  14 mg Transdermal Daily Trudy Carwin, NP   14 mg at 08/01/23 0754   OLANZapine (ZYPREXA) injection 10 mg  10 mg Intramuscular TID PRN Trudy Carwin, NP       OLANZapine (ZYPREXA) injection 5 mg  5 mg Intramuscular TID PRN Trudy Carwin, NP       OLANZapine zydis (ZYPREXA) disintegrating tablet 5 mg  5 mg Oral TID PRN Trudy Carwin, NP       risperiDONE (RISPERDAL) tablet 1 mg  1 mg Oral QHS Angelos Wasco I, NP   1 mg at 07/31/23 2207   Lab Results: No results found for this or any previous visit (from the past 48 hours).  Blood Alcohol level:  Lab Results  Component Value Date   [facility] <15 07/29/2023   Metabolic Disorder Labs: No results found for: HGBA1C, MPG No results found for: PROLACTIN No results found for: CHOL, TRIG, HDL, CHOLHDL, VLDL, LDLCALC  Physical Findings: AIMS:  ,  ,  ,  ,  ,  ,   CIWA:    COWS:     Musculoskeletal: Strength & Muscle Tone: within normal limits Gait & Station: normal Patient leans: N/A  Psychiatric Specialty Exam:  Presentation  General  Appearance:  Casual; Disheveled  Eye Contact: Fair  Speech: Clear and Coherent; Normal Rate  Speech Volume: Normal  Handedness: Right  Mood and Affect  Mood: -- (Reports feeling disappointed, but denies any symptoms of depression or anxiety.)  Affect: Congruent  Thought Process  Thought Processes: Coherent; Linear  Descriptions of Associations:Intact  Orientation:Full (Time, Place and Person)  Thought Content:Logical  History of Schizophrenia/Schizoaffective disorder:No data recorded Duration of Psychotic Symptoms:No data recorded Hallucinations:Hallucinations: None  Ideas of Reference:None  Suicidal Thoughts:Suicidal Thoughts: No  Homicidal Thoughts:Homicidal  Thoughts: No  Sensorium  Memory: Immediate Good; Recent Good; Remote Good  Judgment: Fair  Insight: Lacking  Executive Functions  Concentration: Good  Attention Span: Good  Recall: Good  Fund of Knowledge: Fair  Language: Good  Psychomotor Activity  Psychomotor Activity: Psychomotor Activity: Normal  Assets  Assets: Manufacturing systems engineer; Housing; Social Support  Sleep  Sleep: Sleep: Good Number of Hours of Sleep: 7.5  Physical Exam: Physical Exam Vitals and nursing note reviewed.  HENT:     Head: Normocephalic.     Nose: Nose normal.   Cardiovascular:     Rate and Rhythm: Normal rate.     Pulses: Normal pulses.  Pulmonary:     Effort: Pulmonary effort is normal.  Genitourinary:    Comments: Deferred  Musculoskeletal:        General: Normal range of motion.     Cervical back: Normal range of motion.   Skin:    General: Skin is dry.   Neurological:     General: No focal deficit present.     Mental Status: He is alert and oriented to person, place, and time.    Review of Systems  Constitutional:  Negative for chills, diaphoresis and fever.  HENT:  Negative for congestion and sore throat.   Respiratory:  Negative for cough, shortness of breath and  wheezing.   Cardiovascular:  Negative for chest pain and palpitations.  Gastrointestinal:  Negative for abdominal pain, constipation, diarrhea, heartburn, nausea and vomiting.  Genitourinary:  Negative for dysuria.  Musculoskeletal:  Negative for joint pain and myalgias.  Skin:  Negative for itching and rash.  Neurological:  Negative for dizziness, tingling, tremors, sensory change, speech change, focal weakness, seizures, loss of consciousness, weakness and headaches.  Endo/Heme/Allergies:        NKDA  Psychiatric/Behavioral:  Positive for substance abuse. Negative for hallucinations and memory loss.    Blood pressure 111/70, pulse (!) 57, temperature 98.4 F (36.9 C), temperature source Oral, resp. rate 12, height 5' 8 (1.727 m), weight 68.9 kg, SpO2 100%. Body mass index is 23.11 kg/m.  Treatment Plan Summary: Daily contact with patient to assess and evaluate symptoms and progress in treatment and Medication management.   Principal/active diagnoses.  Substance induced mood disorder. MDD (major depressive disorder)  Plan: The risks/benefits/side-effects/alternatives to the medications in use were discussed in detail with the patient and time was given for patient's questions. The patient consents to medication trial.    -Continue Risperdal 1 mg po Q hs for mood control. -Continue the agitation protocols as recommended. See MAR.   Opioid use disorder.  Continue Methadone 70 mg po daily.   Other PRNS -Continue Tylenol  650 mg every 6 hours PRN for mild pain -Continue Maalox 30 ml Q 4 hrs PRN for indigestion -Continue MOM 30 ml po Q 6 hrs for constipation   Safety and Monitoring: Voluntary admission to inpatient psychiatric unit for safety, stabilization and treatment Daily contact with patient to assess and evaluate symptoms and progress in treatment Patient's case to be discussed in multi-disciplinary team meeting Observation Level : q15 minute checks Vital signs: q12  hours Precautions: Safety   Discharge Planning: Social work and case management to assist with discharge planning and identification of hospital follow-up needs prior to discharge Estimated LOS: 5-7 days Discharge Concerns: Need to establish a safety plan; Medication compliance and effectiveness Discharge Goals: Return home with outpatient referrals for mental health follow-up including medication management/psychotherapy  Mac Bolster, NP, pmhnp,fnp-bc. 08/01/2023, 9:59 AM

## 2023-08-01 NOTE — BHH Group Notes (Signed)
 The focus of this group is to help patients establish daily goals to achieve during treatment and discuss how the patient can incorporate goal setting into their daily lives to aide in recovery.     Scale 1-10 8   Goal: Prepare for discharge

## 2023-08-01 NOTE — Plan of Care (Signed)
   Problem: Education: Goal: Emotional status will improve Outcome: Progressing Goal: Mental status will improve Outcome: Progressing   Problem: Activity: Goal: Interest or engagement in activities will improve Outcome: Progressing Goal: Sleeping patterns will improve Outcome: Progressing

## 2023-08-01 NOTE — Progress Notes (Addendum)
 Talked to pt and pt was more receptive, pt had pleasant attitude, we talked about his Tx moving forward and pt appeared to have good insight. Pt girlfriend left a message for me to call her back tonight , but pt told me I did not have to call her back due to him just getting off the phone and her being ok. Pt took his HS Risperdal  and education about adjustment to new medications was received by pt. Pt appeared to have a better attitude about moving forward in the future. Pt was educated on the possibility of the Methadone  could be the thing that was making him drowsy.

## 2023-08-01 NOTE — [facility] Suicide Risk Assessment (Signed)
 [facility] INPATIENT:  Family/Significant Other Suicide Prevention Education  Suicide Prevention Education:  Patient Refusal for Family/Significant Other Suicide Prevention Education: The patient Jermaine Hood has refused to provide written consent for family/significant other to be provided Family/Significant Other Suicide Prevention Education during admission and/or prior to discharge.  Physician notified.  Kyshawn Teal N Promise Weldin, LCSW 08/01/2023, 5:57 PM

## 2023-08-02 DIAGNOSIS — F333 Major depressive disorder, recurrent, severe with psychotic symptoms: Secondary | ICD-10-CM | POA: Diagnosis not present

## 2023-08-02 NOTE — Progress Notes (Signed)
 Patient ID: Jermaine Hood, male   DOB: [birth year], 37 y.o.   MRN: [id number] CSW reached out to [facility] to make them aware that Pt has been admitted. No demographic information was provided to them regarding Jermaine Hood and his specific admit. The representative with [facility] states that as long as Pt has discharge papework showing he was admitted this should suffice. CSW documented in follow-up instructions that Pt would need to take his discharge paperwork to his next dosing appointment.   Averyanna Sax N Shanquita Ronning, LCSW 08/02/23 4:55 PM

## 2023-08-02 NOTE — Group Note (Signed)
 Date:  08/02/2023 Time:  9:42 PM  Group Topic/Focus:  NA/AA meeting     Participation Level:  Active  Participation Quality:  Appropriate and Attentive  Affect:  Appropriate  Cognitive:  Appropriate  Insight: Appropriate  Engagement in Group:  Engaged  Modes of Intervention:  Discussion  Additional Comments:  Patient was very active verbally in AA group.  He shared and asked a lot of questions   Jermaine Hood 08/02/2023, 9:42 PM

## 2023-08-02 NOTE — Plan of Care (Signed)
 Nurse discussed anxiety, depression and coping skills with patient.

## 2023-08-02 NOTE — Group Note (Signed)
 Recreation Therapy Group Note   Group Topic:Team Building  Group Date: 08/02/2023 Start Time: 0930 End Time: 1000 Facilitators: Laiah Pouncey-McCall, LRT,CTRS Location: [address] Day[address]   Group Topic: Communication, Team Building, Problem Solving  Goal Area(s) Addresses:  Patient will effectively work with peer towards shared goal.  Patient will identify skills used to make activity successful.  Patient will identify how skills used during activity can be used to reach post d/c goals.   Behavioral Response: Engaged  Intervention: STEM Activity  Activity: Straw Bridge. In teams of 3-5, patients were given 15 plastic drinking straws and an equal length of masking tape. Using the materials provided, patients were instructed to build a free standing bridge-like structure to suspend an everyday item (ex: puzzle box) off of the floor or table surface. All materials were required to be used by the team in their design. LRT facilitated post-activity discussion reviewing team process. Patients were encouraged to reflect how the skills used in this activity can be generalized to daily life post discharge.   Education: Pharmacist, community, Scientist, physiological, Discharge Planning   Education Outcome: Acknowledges education/In group clarification offered/Needs additional education.    Affect/Mood: Appropriate   Participation Level: Engaged   Participation Quality: Independent   Behavior: Appropriate   Speech/Thought Process: Focused   Insight: Good   Judgement: Good   Modes of Intervention: STEM Activity   Patient Response to Interventions:  Engaged   Education Outcome:  In group clarification offered    Clinical Observations/Individualized Feedback: Pt was bright and engaged during group. Pt had great interaction with peers in creating a structure that would successful.     Plan: Continue to engage patient in RT group sessions 2-3x/week.   Shameka Aggarwal-McCall,  LRT,CTRS 08/02/2023 12:40 PM

## 2023-08-02 NOTE — Progress Notes (Addendum)
 Patient stated he wants to revoke his POA.  Change IVC to voluntary, prepare for discharge.  Patient stated he wants to talk to MD about this.  Patient took his methadone  70 mg this morning.  Patient has denied SI and HI, contracts for safety.  Denied A/V hallucinations.  Patient has attended group this morning. Safety maintained with 15 minute checks.

## 2023-08-02 NOTE — Progress Notes (Signed)
 [facility] MD Progress Note  08/02/2023 4:51 PM Jermaine Hood  MRN:  [id number]  Reason for admission: 38 y.o. Caucasian male with reported hx of opioid use disorder. Was brought to the [facility] accompanied by the law enforcement agents with complaints of worsening psychosis/paranoid ideations. Patient was IVC'ed by his father Jermaine Hood).   Daily notes:  Jermaine Hood is seen this morning. Chart reviewed. The chart findings discussed with the treatment team. He presents alert, oriented & aware of situation. He is visible on the unit, attending group sessions. He presents with a a very serious & demanding affect this afternoon. He reports, Hood'm doing very well. Hood just have a minor problem here & that is, Hood'm sleeping too much here. Then, when can Hood see my actual psychiatrist? Hood have not seen him or her since being in this hospital. Hood feel like you guys are hiding something from me. Hood also need to know when Hood can be discharged. Hood feel like Hood'm ready to be discharged today. Hood need to see the doctor so that he can tell me what Hood'm still doing in this hospital. Since admission here, Hood have done everything required of me to do. Hood have attended every group sessions, taking my medicines & participating in every activities  here. Now, why am Hood still here? Jermaine Hood currently denies any SIHI, AVH, delusional thoughts or paranoia. He does not appear to be responding to any internal stimuli. Patient is encouraged to continue to take his medications as recommended. He is instructed & encouraged to be patient as he is recommended for discharge on 08-04-23. This plan is based on the fact that if he is discharged tomorrow, his Methadone  clinic will not be open on Sunday & there will be an interruption on his substance abuse treatment. Patient replied, If that is the reason Hood'm scheduled to discharge on Sunday, that should not worry any of you because Hood still have one dose of methadone  at my home that Hood did not have time to take prior to  my IVC'ed to the hospital. If you guys can discharge me tomorrow, there will be no lapse in my Methadone  treatment.  Patient is informed that this will be discussed with the treatment team tomorrow. Patient is in agreement. Vital signs remain stable. There are no changes made on the current plan of care. Continue as already in progress.  Principal Problem: MDD (major depressive disorder)  Diagnosis: Principal Problem:   MDD (major depressive disorder) Active Problems:   Stimulant-induced psychotic disorder (HCC)  Total Time spent with patient: 45.  Past Psychiatric History: See H&P.  Past Medical History:  Past Medical History:  Diagnosis Date   IV drug user     Past Surgical History:  Procedure Laterality Date   APPENDECTOMY     Family History: History reviewed. No pertinent family history.  Family Psychiatric  History: See H&P.  Social History:  Social History   Substance and Sexual Activity  Alcohol Use Yes   Comment: rarely     Social History   Substance and Sexual Activity  Drug Use Yes   Types: Marijuana, Methamphetamines, Fentanyl , Heroin    Social History   Socioeconomic History   Marital status: Single    Spouse name: Not on file   Number of children: Not on file   Years of education: Not on file   Highest education level: Not on file  Occupational History   Not on file  Tobacco Use   Smoking status:  Every Day    Current packs/day: 1.00    Average packs/day: 1 pack/day for 20.5 years (20.5 ttl pk-yrs)    Types: Cigarettes    Start date: 02/06/2003   Smokeless tobacco: Never   Tobacco comments:    cutting back  Substance and Sexual Activity   Alcohol use: Yes    Comment: rarely   Drug use: Yes    Types: Marijuana, Methamphetamines, Fentanyl , Heroin   Sexual activity: Yes    Birth control/protection: Condom  Other Topics Concern   Not on file  Social History Narrative   Not on file   Social Drivers of Health   Financial Resource Strain:  Not on file  Food Insecurity: Food Insecurity Present (07/30/2023)   Hunger Vital Sign    Worried About Running Out of Food in the Last Year: Never true    Ran Out of Food in the Last Year: Sometimes true  Transportation Needs: No Transportation Needs (07/30/2023)   PRAPARE - Administrator, Civil Service (Medical): No    Lack of Transportation (Non-Medical): No  Physical Activity: Not on file  Stress: Not on file  Social Connections: Unknown (11/09/2022)   Received from [facility]   Social Network    Social Network: Not on file   Additional Social History:   Sleep: Good Estimated Sleeping Duration (Last 24 Hours): 6.25-8.00 hours  Appetite:  Good  Current Medications: Current Facility-Administered Medications  Medication Dose Route Frequency Provider Last Rate Last Admin   acetaminophen  (TYLENOL ) tablet 650 mg  650 mg Oral Q6H PRN Trudy Carwin, NP       alum & mag hydroxide-simeth (MAALOX/MYLANTA) 200-200-20 MG/5ML suspension 30 mL  30 mL Oral Q4H PRN Trudy Carwin, NP       magnesium  hydroxide (MILK OF MAGNESIA) suspension 30 mL  30 mL Oral Daily PRN Trudy Carwin, NP   30 mL at 08/02/23 1021   methadone  (DOLOPHINE ) tablet 70 mg  70 mg Oral Daily Jermaine Bordas I, NP   70 mg at 08/02/23 1011   nicotine  (NICODERM CQ  - dosed in mg/24 hours) patch 14 mg  14 mg Transdermal Daily Trudy Carwin, NP   14 mg at 08/02/23 9145   OLANZapine  (ZYPREXA ) injection 10 mg  10 mg Intramuscular TID PRN Trudy Carwin, NP       OLANZapine  (ZYPREXA ) injection 5 mg  5 mg Intramuscular TID PRN Trudy Carwin, NP       OLANZapine  zydis (ZYPREXA ) disintegrating tablet 5 mg  5 mg Oral TID PRN Trudy Carwin, NP       risperiDONE  (RISPERDAL ) tablet 1 mg  1 mg Oral QHS Jermaine Londo I, NP   1 mg at 08/01/23 2126   Lab Results: No results found for this or any previous visit (from the past 48 hours).  Blood Alcohol level:  Lab Results  Component Value Date   [facility] <15 07/29/2023   Metabolic  Disorder Labs: No results found for: HGBA1C, MPG No results found for: PROLACTIN No results found for: CHOL, TRIG, HDL, CHOLHDL, VLDL, LDLCALC  Physical Findings: AIMS:  ,  ,  ,  ,  ,  ,   CIWA:    COWS:     Musculoskeletal: Strength & Muscle Tone: within normal limits Gait & Station: normal Patient leans: N/A  Psychiatric Specialty Exam:  Presentation  General Appearance:  Appropriate for Environment; Casual; Fairly Groomed  Eye Contact: Good  Speech: Clear and Coherent; Normal Rate  Speech Volume: Normal  Handedness:  Right  Mood and Affect  Mood: -- (Hood feel disappointed that Hood have not been able to see my primary psychiatrist since being in this hospital.)  Affect: Congruent  Thought Process  Thought Processes: Coherent; Goal Directed  Descriptions of Associations:Intact  Orientation:Full (Time, Place and Person)  Thought Content:Logical  History of Schizophrenia/Schizoaffective disorder:No data recorded Duration of Psychotic Symptoms:No data recorded Hallucinations:Hallucinations: None   Ideas of Reference:None  Suicidal Thoughts:Suicidal Thoughts: No   Homicidal Thoughts:Homicidal Thoughts: No   Sensorium  Memory: Immediate Good; Recent Good; Remote Good  Judgment: Fair  Insight: Fair  Art therapist  Concentration: Fair  Attention Span: Fair  Recall: Good  Fund of Knowledge: Good  Language: Good  Psychomotor Activity  Psychomotor Activity: Psychomotor Activity: Normal   Assets  Assets: Communication Skills; Desire for Improvement; Housing; Social Support; Physical Health  Sleep  Sleep: Sleep: Good Number of Hours of Sleep: 8.5   Physical Exam: Physical Exam Vitals and nursing note reviewed.  HENT:     Head: Normocephalic.     Nose: Nose normal.   Cardiovascular:     Rate and Rhythm: Normal rate.     Pulses: Normal pulses.  Pulmonary:     Effort: Pulmonary effort is normal.   Genitourinary:    Comments: Deferred  Musculoskeletal:        General: Normal range of motion.     Cervical back: Normal range of motion.   Skin:    General: Skin is dry.   Neurological:     General: No focal deficit present.     Mental Status: He is alert and oriented to person, place, and time.    Review of Systems  Constitutional:  Negative for chills, diaphoresis and fever.  HENT:  Negative for congestion and sore throat.   Respiratory:  Negative for cough, shortness of breath and wheezing.   Cardiovascular:  Negative for chest pain and palpitations.  Gastrointestinal:  Negative for abdominal pain, constipation, diarrhea, heartburn, nausea and vomiting.  Genitourinary:  Negative for dysuria.  Musculoskeletal:  Negative for joint pain and myalgias.  Skin:  Negative for itching and rash.  Neurological:  Negative for dizziness, tingling, tremors, sensory change, speech change, focal weakness, seizures, loss of consciousness, weakness and headaches.  Endo/Heme/Allergies:        NKDA  Psychiatric/Behavioral:  Positive for substance abuse. Negative for hallucinations and memory loss.    Blood pressure (!) 141/79, pulse 75, temperature 97.6 F (36.4 C), temperature source Oral, resp. rate 16, height 5' 8 (1.727 m), weight 68.9 kg, SpO2 100%. Body mass index is 23.11 kg/m.  Treatment Plan Summary: Daily contact with patient to assess and evaluate symptoms and progress in treatment and Medication management.   Principal/active diagnoses.  Substance induced mood disorder. MDD (major depressive disorder)  Plan: The risks/benefits/side-effects/alternatives to the medications in use were discussed in detail with the patient and time was given for patient's questions. The patient consents to medication trial.    -Continue Risperdal  1 mg po Q hs for mood control. -Continue the agitation protocols as recommended. See MAR.   Opioid use disorder.  Continue Methadone  70 mg po  daily.   Other PRNS -Continue Tylenol  650 mg every 6 hours PRN for mild pain -Continue Maalox 30 ml Q 4 hrs PRN for indigestion -Continue MOM 30 ml po Q 6 hrs for constipation   Safety and Monitoring: Voluntary admission to inpatient psychiatric unit for safety, stabilization and treatment Daily contact with patient to assess and evaluate  symptoms and progress in treatment Patient's case to be discussed in multi-disciplinary team meeting Observation Level : q15 minute checks Vital signs: q12 hours Precautions: Safety   Discharge Planning: Social work and case management to assist with discharge planning and identification of hospital follow-up needs prior to discharge Estimated LOS: 5-7 days Discharge Concerns: Need to establish a safety plan; Medication compliance and effectiveness Discharge Goals: Return home with outpatient referrals for mental health follow-up including medication management/psychotherapy  Mac Bolster, NP, pmhnp,fnp-bc. 08/02/2023, 4:51 PM Patient ID: Concha GORMAN [place], male   DOB: 1986/01/02, 38 y.o.   MRN: [id number] Patient ID: MATTHIAS BOGUS, male   DOB: 1985-05-17, 38 y.o.   MRN: [id number]

## 2023-08-02 NOTE — Progress Notes (Signed)
   08/02/23 2015  Psych Admission Type (Psych Patients Only)  Admission Status Involuntary  Psychosocial Assessment  Patient Complaints Depression  Eye Contact Fair  Facial Expression Sad;Anxious;Angry  Affect Sad;Flat;Labile;Preoccupied  Speech Argumentative  Interaction Assertive  Motor Activity Slow  Appearance/Hygiene Unremarkable  Behavior Characteristics Cooperative  Mood Depressed  Aggressive Behavior  Effect No apparent injury  Thought Process  Coherency Circumstantial  Content Blaming others  Delusions Paranoid  Perception Hallucinations  Hallucination Auditory  Judgment Impaired  Confusion WDL  Danger to Self  Current suicidal ideation? Denies  Danger to Others  Danger to Others None reported or observed

## 2023-08-02 NOTE — Plan of Care (Signed)
   Problem: Education: Goal: Emotional status will improve Outcome: Progressing Goal: Mental status will improve Outcome: Progressing   Problem: Activity: Goal: Interest or engagement in activities will improve Outcome: Progressing Goal: Sleeping patterns will improve Outcome: Progressing

## 2023-08-02 NOTE — Progress Notes (Signed)
 D:  Patient's self inventory sheet, patient sleeps good, no sleep medicine.  Good appetite, normal energy, good concentration. Denied withdrawals.  Denied SI.  Denied physical problems.  Denied physical pain.  Goal is rescind IVC.  Plan to be transparent with treatment team.  Thank you.  Does have discharge plans.  Has support system in place and a counselor.   A:  Medications administered per MD orders.  Emotional support and encouragement given patient. R:  Denied SI and HI, contracts for safety.  Denied A/V hallucinations.  Safety maintained with 15 minute checks.

## 2023-08-02 NOTE — Progress Notes (Signed)
 [facility] MD Progress Note  08/02/2023 10:40 AM Jermaine Hood  MRN:  [id number]  Reason for admission: 38 y.o. Caucasian male with reported hx of opioid use disorder. Was brought to the [facility] accompanied by the law enforcement agents with complaints of worsening psychosis/paranoid ideations. Patient was IVC'ed by his father Jermaine Hood).   Daily notes: Jermaine Hood is seen this morning. Chart reviewed. The chart findings discussed with the treatment team. He presents alert, oriented & aware of situation. He is visible on the unit, attending group sessions. He presents with a flat, but an improving affect. He reports, I'm still feeling good. My mood is good. I'm sleeping too much here. The sleep medicine made me too sleepy last night. I have been involved & doing everything that I needed to do here such as attending the group sessions. I have some concerns about being here. I need to know when I can get discharged. This time is just a bad time for me to be in the hospital because I got thing plans prior to all of these. And because I was IVC'ed to be here, I would not want to miss the court date because I have to also know what decisions are being made on my life. I feel like I need to be a part of the meeting that is being held about me & my well being. My father cannot continue to do this to me. Other than this, I'm fine. I would also like to see my doctor. Jermaine Hood currently denies any SIHI, AVH, delusional thoughts or paranoia. He does not appear to be responding to any internal stimuli. Patient is encouraged to continue to take his medications as recommended. He is instructed & encouraged to be patient as his body is trying to get use to his new medication Risperdal . Reviewed current lab results, no changes. The new lab orders are yet to be drawn. Vital signs remain stable. There are no changes made on the current plan of care. Continue as already in progress.  Principal Problem: MDD (major depressive  disorder)  Diagnosis: Principal Problem:   MDD (major depressive disorder) Active Problems:   Stimulant-induced psychotic disorder (HCC)  Total Time spent with patient: 45.  Past Psychiatric History: See H&P.  Past Medical History:  Past Medical History:  Diagnosis Date   IV drug user     Past Surgical History:  Procedure Laterality Date   APPENDECTOMY     Family History: History reviewed. No pertinent family history.  Family Psychiatric  History: See H&P.  Social History:  Social History   Substance and Sexual Activity  Alcohol Use Yes   Comment: rarely     Social History   Substance and Sexual Activity  Drug Use Yes   Types: Marijuana, Methamphetamines, Fentanyl , Heroin    Social History   Socioeconomic History   Marital status: Single    Spouse name: Not on file   Number of children: Not on file   Years of education: Not on file   Highest education level: Not on file  Occupational History   Not on file  Tobacco Use   Smoking status: Every Day    Current packs/day: 1.00    Average packs/day: 1 pack/day for 20.5 years (20.5 ttl pk-yrs)    Types: Cigarettes    Start date: 02/06/2003   Smokeless tobacco: Never   Tobacco comments:    cutting back  Substance and Sexual Activity   Alcohol use: Yes    Comment: rarely  Drug use: Yes    Types: Marijuana, Methamphetamines, Fentanyl , Heroin   Sexual activity: Yes    Birth control/protection: Condom  Other Topics Concern   Not on file  Social History Narrative   Not on file   Social Drivers of Health   Financial Resource Strain: Not on file  Food Insecurity: Food Insecurity Present (07/30/2023)   Hunger Vital Sign    Worried About Running Out of Food in the Last Year: Never true    Ran Out of Food in the Last Year: Sometimes true  Transportation Needs: No Transportation Needs (07/30/2023)   PRAPARE - Administrator, Civil Service (Medical): No    Lack of Transportation (Non-Medical): No   Physical Activity: Not on file  Stress: Not on file  Social Connections: Unknown (11/09/2022)   Received from [facility]   Social Network    Social Network: Not on file   Additional Social History:   Sleep: Good Estimated Sleeping Duration (Last 24 Hours): 6.25-8.00 hours  Appetite:  Good  Current Medications: Current Facility-Administered Medications  Medication Dose Route Frequency Provider Last Rate Last Admin   acetaminophen  (TYLENOL ) tablet 650 mg  650 mg Oral Q6H PRN Jermaine Carwin, NP       alum & mag hydroxide-simeth (MAALOX/MYLANTA) 200-200-20 MG/5ML suspension 30 mL  30 mL Oral Q4H PRN Jermaine Carwin, NP       magnesium  hydroxide (MILK OF MAGNESIA) suspension 30 mL  30 mL Oral Daily PRN Jermaine Carwin, NP   30 mL at 08/02/23 1021   methadone  (DOLOPHINE ) tablet 70 mg  70 mg Oral Daily Jermaine Hood I, NP   70 mg at 08/02/23 1011   nicotine  (NICODERM CQ  - dosed in mg/24 hours) patch 14 mg  14 mg Transdermal Daily Jermaine Carwin, NP   14 mg at 08/02/23 9145   OLANZapine  (ZYPREXA ) injection 10 mg  10 mg Intramuscular TID PRN Jermaine Carwin, NP       OLANZapine  (ZYPREXA ) injection 5 mg  5 mg Intramuscular TID PRN Jermaine Carwin, NP       OLANZapine  zydis (ZYPREXA ) disintegrating tablet 5 mg  5 mg Oral TID PRN Jermaine Carwin, NP       risperiDONE  (RISPERDAL ) tablet 1 mg  1 mg Oral QHS Jermaine Hood I, NP   1 mg at 08/01/23 2126   Lab Results: No results found for this or any previous visit (from the past 48 hours).  Blood Alcohol level:  Lab Results  Component Value Date   [facility] <15 07/29/2023   Metabolic Disorder Labs: No results found for: HGBA1C, MPG No results found for: PROLACTIN No results found for: CHOL, TRIG, HDL, CHOLHDL, VLDL, LDLCALC  Physical Findings: AIMS:  ,  ,  ,  ,  ,  ,   CIWA:    COWS:     Musculoskeletal: Strength & Muscle Tone: within normal limits Gait & Station: normal Patient leans: N/A  Psychiatric Specialty  Exam:  Presentation  General Appearance:  Casual; Disheveled  Eye Contact: Fair  Speech: Clear and Coherent; Normal Rate  Speech Volume: Normal  Handedness: Right  Mood and Affect  Mood: -- (Reports feeling disappointed, but denies any symptoms of depression or anxiety.)  Affect: Congruent  Thought Process  Thought Processes: Coherent; Linear  Descriptions of Associations:Intact  Orientation:Full (Time, Place and Person)  Thought Content:Logical  History of Schizophrenia/Schizoaffective disorder:No data recorded Duration of Psychotic Symptoms:No data recorded Hallucinations:No data recorded  Ideas of Reference:None  Suicidal Thoughts:No data  recorded  Homicidal Thoughts:No data recorded  Sensorium  Memory: Immediate Good; Recent Good; Remote Good  Judgment: Fair  Insight: Lacking  Executive Functions  Concentration: Good  Attention Span: Good  Recall: Good  Fund of Knowledge: Fair  Language: Good  Psychomotor Activity  Psychomotor Activity: No data recorded  Assets  Assets: Communication Skills; Housing; Social Support  Sleep  Sleep: No data recorded  Physical Exam: Physical Exam Vitals and nursing note reviewed.  HENT:     Head: Normocephalic.     Nose: Nose normal.   Cardiovascular:     Rate and Rhythm: Normal rate.     Pulses: Normal pulses.  Pulmonary:     Effort: Pulmonary effort is normal.  Genitourinary:    Comments: Deferred  Musculoskeletal:        General: Normal range of motion.     Cervical back: Normal range of motion.   Skin:    General: Skin is dry.   Neurological:     General: No focal deficit present.     Mental Status: He is alert and oriented to person, place, and time.    Review of Systems  Constitutional:  Negative for chills, diaphoresis and fever.  HENT:  Negative for congestion and sore throat.   Respiratory:  Negative for cough, shortness of breath and wheezing.    Cardiovascular:  Negative for chest pain and palpitations.  Gastrointestinal:  Negative for abdominal pain, constipation, diarrhea, heartburn, nausea and vomiting.  Genitourinary:  Negative for dysuria.  Musculoskeletal:  Negative for joint pain and myalgias.  Skin:  Negative for itching and rash.  Neurological:  Negative for dizziness, tingling, tremors, sensory change, speech change, focal weakness, seizures, loss of consciousness, weakness and headaches.  Endo/Heme/Allergies:        NKDA  Psychiatric/Behavioral:  Positive for substance abuse. Negative for hallucinations and memory loss.    Blood pressure 124/75, pulse 65, temperature 97.6 F (36.4 C), temperature source Oral, resp. rate 16, height 5' 8 (1.727 m), weight 68.9 kg, SpO2 100%. Body mass index is 23.11 kg/m.  Treatment Plan Summary: Daily contact with patient to assess and evaluate symptoms and progress in treatment and Medication management.   Principal/active diagnoses.  Substance induced mood disorder. MDD (major depressive disorder)  Plan: The risks/benefits/side-effects/alternatives to the medications in use were discussed in detail with the patient and time was given for patient's questions. The patient consents to medication trial.    -Continue Risperdal  1 mg po Q hs for mood control. -Continue the agitation protocols as recommended. See MAR.   Opioid use disorder.  Continue Methadone  70 mg po daily.   Other PRNS -Continue Tylenol  650 mg every 6 hours PRN for mild pain -Continue Maalox 30 ml Q 4 hrs PRN for indigestion -Continue MOM 30 ml po Q 6 hrs for constipation   Safety and Monitoring: Voluntary admission to inpatient psychiatric unit for safety, stabilization and treatment Daily contact with patient to assess and evaluate symptoms and progress in treatment Patient's case to be discussed in multi-disciplinary team meeting Observation Level : q15 minute checks Vital signs: q12 hours Precautions:  Safety   Discharge Planning: Social work and case management to assist with discharge planning and identification of hospital follow-up needs prior to discharge Estimated LOS: 5-7 days Discharge Concerns: Need to establish a safety plan; Medication compliance and effectiveness Discharge Goals: Return home with outpatient referrals for mental health follow-up including medication management/psychotherapy  Mac Bolster, NP, pmhnp,fnp-bc. 08/02/2023, 10:40 AM Patient ID: Jermaine Hood  Jermaine Hood, male   DOB: 01-19-1986, 38 y.o.   MRN: [id number]

## 2023-08-03 DIAGNOSIS — F333 Major depressive disorder, recurrent, severe with psychotic symptoms: Secondary | ICD-10-CM

## 2023-08-03 MED ORDER — NICOTINE 14 MG/24HR TD PT24: 14.0000 mg | MEDICATED_PATCH | Freq: Every day | TRANSDERMAL | 0 refills | Status: DC

## 2023-08-03 MED ORDER — RISPERIDONE 1 MG PO TABS: 1.0000 mg | ORAL_TABLET | Freq: Every day | ORAL | 0 refills | Status: DC

## 2023-08-03 MED ORDER — WHITE PETROLATUM EX OINT
TOPICAL_OINTMENT | CUTANEOUS | Status: AC
  Administered 2023-08-03: 1
  Filled 2023-08-03: qty 5

## 2023-08-03 NOTE — Progress Notes (Signed)
   08/03/23 1000  Psych Admission Type (Psych Patients Only)  Admission Status Involuntary  Psychosocial Assessment  Patient Complaints Anxiety  Eye Contact Fair  Facial Expression Anxious  Affect Preoccupied  Speech Logical/coherent  Interaction Assertive  Motor Activity Slow  Appearance/Hygiene Unremarkable  Behavior Characteristics Cooperative  Mood Anxious  Aggressive Behavior  Effect No apparent injury  Thought Process  Coherency Circumstantial  Content Blaming others  Delusions None reported or observed  Perception WDL  Hallucination None reported or observed  Judgment Impaired  Confusion None  Danger to Self  Current suicidal ideation? Denies  Danger to Others  Danger to Others None reported or observed

## 2023-08-03 NOTE — [facility] Suicide Risk Assessment (Signed)
 Suicide Risk Assessment  Discharge Assessment    [facility] Discharge Suicide Risk Assessment   Principal Problem: MDD (major depressive disorder)  Discharge Diagnoses: Principal Problem:   MDD (major depressive disorder) Active Problems:   Stimulant-induced psychotic disorder (HCC)  Total Time spent with patient: Greater than 30 minutes including the discharge summary.  Musculoskeletal: Strength & Muscle Tone: within normal limits Gait & Station: normal Patient leans: N/A  Psychiatric Specialty Exam Presentation  General Appearance:  Appropriate for Environment; Casual; Fairly Groomed  Eye Contact: Good  Speech: Clear and Coherent; Normal Rate  Speech Volume: Normal  Handedness: Right   Mood and Affect  Mood: Euthymic  Duration of Depression Symptoms: Greater than one week. Affect: Appropriate; Congruent   Thought Process  Thought Processes: Coherent; Goal Directed  Descriptions of Associations:Intact  Orientation:Full (Time, Place and Person)  Thought Content:Logical  History of Schizophrenia/Schizoaffective disorder: NA.  Duration of Psychotic Symptoms: NA  Hallucinations:Hallucinations: None  Ideas of Reference:None  Suicidal Thoughts:Suicidal Thoughts: No  Homicidal Thoughts:Homicidal Thoughts: No   Sensorium  Memory: Immediate Good; Recent Good; Remote Good  Judgment: Good  Insight: Fair  Art therapist  Concentration: Good  Attention Span: Good  Recall: Good  Fund of Knowledge: Fair  Language: Good  Psychomotor Activity  Psychomotor Activity: Psychomotor Activity: Normal  Assets  Assets: Communication Skills; Desire for Improvement; Housing; Physical Health; Social Support  Sleep  Sleep: Sleep: Good  Estimated Sleeping Duration (Last 24 Hours): 6.00-6.75 hours  Physical/Ros Exam: See Discharge summary.  Blood pressure 129/73, pulse 65, temperature 98.4 F (36.9 C), temperature source Oral, resp.  rate 16, height 5' 8 (1.727 m), weight 68.9 kg, SpO2 100%. Body mass index is 23.11 kg/m.  Mental Status Per Nursing Assessment::   On Admission:  NA  Demographic Factors:  Male, Adolescent or young adult, Caucasian, Low socioeconomic status, and Unemployed  Loss Factors: NA  Historical Factors: NA  Risk Reduction Factors:   Sense of responsibility to family, Living with another person, especially a relative, Positive social support, Positive therapeutic relationship, and Positive coping skills or problem solving skills  Continued Clinical Symptoms:  Depression:   Impulsivity Alcohol/Substance Abuse/Dependencies More than one psychiatric diagnosis  Cognitive Features That Contribute To Risk:  Polarized thinking and Thought constriction (tunnel vision)    Suicide Risk:  Minimal: No identifiable suicidal ideation.  Patients presenting with no risk factors but with morbid ruminations; may be classified as minimal risk based on the severity of the depressive symptoms   Follow-up Information     [place]. Go on 08/08/2023.   Specialty: Professional Counselor Why: Please go to this provider on 08/08/23 at 9:00 am for an assessment, to obtain therapy services.  You may also go on Monday through Friday, from 9 am to 1 pm. Contact information: [address]  [address] [place] [address] [phone]         [facility]. Go on 08/13/2023.   Specialty: Behavioral Health Why: Please go to this provider on 08/13/23 at 7:00 am for an assessment, to obtain medication management services. You may also go on Monday through Friday, arrive by 7:00 am for an assessment. Contact information: [address] [place]  [address] [phone]        [place] Follow up.   Why: Please continue services with [facility] for addiction treatment and reovery services. Please take your discharge papework in  hand to your next dosing appointment. Contact information:  [address] [place] [address] [phone]                Plan Of Care/Follow-up recommendations:  See the follow-up recommendations above.  Mac Bolster, NP, pmhnp, fnp-bc. 08/03/2023, 10:10 AM

## 2023-08-03 NOTE — [facility] Suicide Risk Assessment (Signed)
 [facility] INPATIENT:  Family/Significant Other Suicide Prevention Education  SPE completed with pt, as pt refused to consent to family contact. SPI pamphlet provided to pt and pt was encouraged to share information with support network, ask questions, and talk about any concerns relating to SPE. Pt denies access to guns/firearms and verbalized understanding of information provided. Mobile Crisis information also provided to pt.      [employer], LCSW

## 2023-08-03 NOTE — Discharge Summary (Signed)
 Physician Discharge Summary Note  Patient:  Jermaine Hood is an 38 y.o., male  MRN:  [id number]  DOB:  November 19, 1985  Patient phone:  [phone] (home)   Patient address:   [address] [place] [address],   Total Time spent with patient: Greater than 30 minutes.  Date of Admission:  07/30/2023  Date of Discharge: 08-02-24  Reason for Admission: Complaints of worsening psychosis/paranoid ideations. Patient was IVC'ed by his father.   Principal Problem: MDD (major depressive disorder)  Discharge Diagnoses: Principal Problem:   MDD (major depressive disorder) Active Problems:   Stimulant-induced psychotic disorder (HCC)  Past Psychiatric History: MDD, Stimulant use disorder, opioid use disorder.  Past Medical History:  Past Medical History:  Diagnosis Date   IV drug user     Past Surgical History:  Procedure Laterality Date   APPENDECTOMY     Family History: History reviewed. No pertinent family history.  Family Psychiatric  History: See H&P.  Social History:  Social History   Substance and Sexual Activity  Alcohol Use Yes   Comment: rarely     Social History   Substance and Sexual Activity  Drug Use Yes   Types: Marijuana, Methamphetamines, Fentanyl , Heroin    Social History   Socioeconomic History   Marital status: Single    Spouse name: Not on file   Number of children: Not on file   Years of education: Not on file   Highest education level: Not on file  Occupational History   Not on file  Tobacco Use   Smoking status: Every Day    Current packs/day: 1.00    Average packs/day: 1 pack/day for 20.5 years (20.5 ttl pk-yrs)    Types: Cigarettes    Start date: 02/06/2003   Smokeless tobacco: Never   Tobacco comments:    cutting back  Substance and Sexual Activity   Alcohol use: Yes    Comment: rarely   Drug use: Yes    Types: Marijuana, Methamphetamines, Fentanyl , Heroin   Sexual activity: Yes    Birth control/protection: Condom   Other Topics Concern   Not on file  Social History Narrative   Not on file   Social Drivers of Health   Financial Resource Strain: Not on file  Food Insecurity: Food Insecurity Present (07/30/2023)   Hunger Vital Sign    Worried About Running Out of Food in the Last Year: Never true    Ran Out of Food in the Last Year: Sometimes true  Transportation Needs: No Transportation Needs (07/30/2023)   PRAPARE - Administrator, Civil Service (Medical): No    Lack of Transportation (Non-Medical): No  Physical Activity: Not on file  Stress: Not on file  Social Connections: Unknown (11/09/2022)   Received from [facility]   Social Network    Social Network: Not on file   Hospital Course: (Per admission evaluation notes): 38 y.o. Caucasian male with reported hx of opioid use disorder. Was brought to the [facility] accompanied by the law enforcement agents with complaints of worsening psychosis/paranoid ideations. Patient was IVC'ed by his father Jermaine Hood).   Upon the decision by his treatment team to discharge [name] today, he was seen & evaluated for mood stability. The current laboratory findings were reviewed, stable. The nurses notes & vital signs were reviewed as well. All are stable. At this present time, there are no current mental health or medical issues that should prevent this discharge at this time. Patient is being discharged to  continue mental health care, medication management including his methadone  treatment as noted below. He is also aware & agreeable to this discharge.  Lehman was admitted to the [facility] adult unit for worsening psychosis & paranoid ideations. 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 He was receiving methadone  treatment for his opioid addiction on an outpatient basis. His reasons for this admission could likely be drug induced. He initially presented to the hospital psychotic. He was in  need of mood stabilization treatment. After evaluation of his presenting symptoms, the medication regimen targeting those symptoms were initiated. Patient was involved in making decisions pertaining to his care. He agreed on taking Risperdal  1 mg Q hs for the psychotic symptoms. He was also enrolled & participated in the group counseling sessions. He learned coping skills. He presented no other significant pre-existing medical problems that required treatment. He tolerated his treatment regimen without any adverse effects or reactions reported.  Caid's symptoms responded well to his treatment regimen warranting this discharge. Initially, the treatment team had suggested that Rue should be better of getting discharged tomorrow (Sunday 08-04-23). The reason for this decision is to prevent any lapse in his dosing of his methadone  for his opioid use disorder. The methadone  clinic does not open on Sundays. Patient was resumed on his methadone  tablets after his admission here. Prior to admission, he was going to the methadone  clinic every morning for the dosing of his methadone . However, patient did approach the providers yesterday stating that he is mentally & medically stable for discharge. He added that he does have a dose of Methadone  tablets at his residence that he did not have time to take because his father had involuntarily committed to the hospital. He stated that this methadone  is still available for him to take on Sunday if he could be discharged on Saturday 08-03-23. Other than prevention of lapse on his methadone  treatment, patient based on his presentation affect/mannerisms & symptom resolution, has indeed met the maximum benefit of this hospitalization. There are no more criteria to keep him admitted to the hospital. The treatment team agreed to discharge [facility] today.   During his hospital stay, Ausar was evaluated daily by a clinical provider to ascertain his response to his treatment  regimen. As the days go by, improvement was noted as evidenced by his report of decreasing symptoms, improved sleep, mood, affect & participation in the unit programming. He was required on daily basis to complete a self-inventory asssessment noting mood, mental status, new symptoms, anxiety or concerns. His symptoms responded well to his treatment regimen warranting this discharge at this time.  On this day of his discharge, Chadley was in much improved condition than upon admission. His symptoms were reported as resolved completely. Upon discharge, he denies SI/HI & voiced no AVH. He does not appear to be responding to any internal stimuli. He is motivated to continue taking medication with a goal of continued improvement in mental health/maintaining sobriety. He was able to engage in safety planning including plan to return to [facility] or contact emergency services if he feels unable to maintain his own safety or the safety of others. Pt had no further questions, comments, or concerns. He left [facility] with all personal belongings in no apparent distress. Transportation per his friend.  Physical Findings: AIMS:  , ,  ,  ,  ,  ,   CIWA:    COWS:     Musculoskeletal: Strength & Muscle  Tone: within normal limits Gait & Station: normal Patient leans: N/A   Psychiatric Specialty Exam:  Presentation  General Appearance:  Appropriate for Environment; Casual; Fairly Groomed  Eye Contact: Good  Speech: Clear and Coherent; Normal Rate  Speech Volume: Normal  Handedness: Right   Mood and Affect  Mood: Euthymic  Affect: Appropriate; Congruent   Thought Process  Thought Processes: Coherent; Goal Directed  Descriptions of Associations:Intact  Orientation:Full (Time, Place and Person)  Thought Content:Logical  History of Schizophrenia/Schizoaffective disorder: NA  Duration of Psychotic Symptoms: NA  Hallucinations:Hallucinations: None  Ideas of Reference:None  Suicidal  Thoughts:Suicidal Thoughts: No  Homicidal Thoughts:Homicidal Thoughts: No   Sensorium  Memory: Immediate Good; Recent Good; Remote Good  Judgment: Good  Insight: Fair  Art therapist  Concentration: Good  Attention Span: Good  Recall: Good  Fund of Knowledge: Fair  Language: Good  Psychomotor Activity  Psychomotor Activity: Psychomotor Activity: Normal   Assets  Assets: Communication Skills; Desire for Improvement; Housing; Physical Health; Social Support   Sleep  Sleep: Sleep: Good  Estimated Sleeping Duration (Last 24 Hours): 6.00-6.75 hours  Physical Exam: Physical Exam Vitals and nursing note reviewed.  HENT:     Head: Normocephalic.     Nose: Nose normal.   Cardiovascular:     Rate and Rhythm: Tachycardia present.     Pulses: Normal pulses.  Pulmonary:     Effort: Pulmonary effort is normal.  Genitourinary:    Comments: Deferred  Musculoskeletal:        General: Normal range of motion.     Cervical back: Normal range of motion.   Skin:    General: Skin is dry.   Neurological:     General: No focal deficit present.     Mental Status: He is alert and oriented to person, place, and time. Mental status is at baseline.    Review of Systems  Constitutional:  Negative for chills, diaphoresis and fever.  HENT:  Negative for congestion and sore throat.   Eyes:  Negative for blurred vision.  Respiratory:  Negative for cough, shortness of breath and wheezing.   Cardiovascular:  Negative for chest pain and palpitations.  Gastrointestinal:  Negative for abdominal pain, constipation, diarrhea, heartburn, nausea and vomiting.  Genitourinary:  Negative for dysuria.  Musculoskeletal:  Negative for joint pain and myalgias.  Skin:  Negative for itching and rash.  Neurological:  Negative for dizziness, tingling, tremors, sensory change, speech change, focal weakness, seizures, loss of consciousness, weakness and headaches.   Endo/Heme/Allergies:        NKDA  Psychiatric/Behavioral:  Positive for depression (Hx of (stable upon discharge).) and substance abuse (Hx. Opioid use disorder, chronic.). Negative for hallucinations, memory loss and suicidal ideas. The patient is not nervous/anxious and does not have insomnia.    Blood pressure 129/73, pulse 65, temperature 98.4 F (36.9 C), temperature source Oral, resp. rate 16, height 5' 8 (1.727 m), weight 68.9 kg, SpO2 100%. Body mass index is 23.11 kg/m.   Social History   Tobacco Use  Smoking Status Every Day   Current packs/day: 1.00   Average packs/day: 1 pack/day for 20.5 years (20.5 ttl pk-yrs)   Types: Cigarettes   Start date: 02/06/2003  Smokeless Tobacco Never  Tobacco Comments   cutting back   Tobacco Cessation:  An FDA-approved tobacco cessation medication recommended at discharge  Blood Alcohol level:  Lab Results  Component Value Date   [facility] <15 07/29/2023   Metabolic Disorder Labs:  No results found for:  HGBA1C, MPG No results found for: PROLACTIN No results found for: CHOL, TRIG, HDL, CHOLHDL, VLDL, LDLCALC  See Psychiatric Specialty Exam and Suicide Risk Assessment completed by Attending Physician prior to discharge.  Discharge destination:  Home  Is patient on multiple antipsychotic therapies at discharge:  No   Has Patient had three or more failed trials of antipsychotic monotherapy by history:  No  Recommended Plan for Multiple Antipsychotic Therapies: NA  Allergies as of 08/03/2023   No Known Allergies      Medication List     TAKE these medications      Indication  methadone  10 MG/ML solution Commonly known as: DOLOPHINE  Take 70 mg by mouth daily.  Indication: Opioid Dependence   nicotine  14 mg/24hr patch Commonly known as: NICODERM CQ  - dosed in mg/24 hours Place 1 patch (14 mg total) onto the skin daily. (May buy from over the counter: For smoking cessation. Start taking on: August 04, 2023   Indication: Nicotine  Addiction   risperiDONE  1 MG tablet Commonly known as: RISPERDAL  Take 1 tablet (1 mg total) by mouth at bedtime. For mood control  Indication: Mood control.        Follow-up Information     [place] Go on 08/08/2023.   Specialty: Professional Counselor Why: Please go to this provider on 08/08/23 at 9:00 am for an assessment, to obtain therapy services.  You may also go on Monday through Friday, from 9 am to 1 pm. Contact information: [address]  [address] [place] [address] [phone]         [facility]. Go on 08/13/2023.   Specialty: Behavioral Health Why: Please go to this provider on 08/13/23 at 7:00 am for an assessment, to obtain medication management services. You may also go on Monday through Friday, arrive by 7:00 am for an assessment. Contact information: [address] [place]  [address] [phone]        [place].   Why: Please continue services with [facility] for addiction treatment and reovery services. Please take your discharge papework in hand to your next dosing appointment. Contact information: [address] [place] [address] [phone]                Plan Of Care/Follow-up recommendations:  Activity: as tolerated Diet: heart healthy  Other: -Follow-up with your outpatient psychiatric provider -instructions on appointment date, time, and address (location) are provided to you in discharge paperwork.  -Take your psychiatric medications as prescribed at discharge - instructions are provided to you in the discharge paperwork  -Follow-up with outpatient primary care doctor and other specialists -for management of preventative medicine and chronic medical issues  -Testing: Follow-up with outpatient provider for abnormal lab results: NA  -If you are prescribed an atypical antipsychotic medication, we  recommend that your outpatient psychiatrist follow routine screening for side effects within 3 months of discharge, including monitoring: AIMS scale, height, weight, blood pressure, fasting lipid panel, HbA1c, and fasting blood sugar.   -Recommend total abstinence from alcohol, tobacco, and other illicit drug use at discharge.   -If your psychiatric symptoms recur, worsen, or if you have side effects to your psychiatric medications, call your outpatient psychiatric provider, 911, [phone] or go to the nearest emergency department.  -If suicidal thoughts occur, immediately call your outpatient psychiatric provider, 911, [phone] or go to the nearest emergency department.  Signed: Mac Bolster, NP, pmhnp, fnp-bc. 08/03/2023, 10:13 AM

## 2023-08-03 NOTE — Accreditation Note (Signed)
Pt discharged to lobby. Pt was stable and appreciative at that time. All papers and prescriptions were given and valuables returned. Suicide safety plan completed. Verbal understanding expressed. Denies SI/HI and A/VH. Pt given opportunity to express concerns and ask questions.

## 2023-08-03 NOTE — Progress Notes (Signed)
  [facility] Adult Case Management Discharge Plan :  Will you be returning to the same living situation after discharge:  Yes,  will be returning home At discharge, do you have transportation home?: Yes,  friend will provide transportation Do you have the ability to pay for your medications: Yes,  patient is covered under insurance  Release of information consent forms completed and in the chart;  Patient's signature needed at discharge.  Patient to Follow up at:  Follow-up Information     [place] Go on 08/08/2023.   Specialty: Professional Counselor Why: Please go to this provider on 08/08/23 at 9:00 am for an assessment, to obtain therapy services.  You may also go on Monday through Friday, from 9 am to 1 pm. Contact information: [address]  [address] [place] [address] [phone]         [facility]. Go on 08/13/2023.   Specialty: Behavioral Health Why: Please go to this provider on 08/13/23 at 7:00 am for an assessment, to obtain medication management services. You may also go on Monday through Friday, arrive by 7:00 am for an assessment. Contact information: [address] [place]  [address] [phone]        [place].   Why: Please continue services with [facility] for addiction treatment and reovery services. Please take your discharge papework in hand to your next dosing appointment. Contact information: [address] [place] [address] [phone]                 Next level of care provider has access to [facility] Link:no  Safety Planning and Suicide Prevention discussed: Yes,  completed with patient on 08/04/23     Has patient been referred to the Quitline?: Yes, faxed/e-referral on 08/03/23  Patient has been referred for addiction treatment: Yes, referral information given but appointment not made [facility].Jermaine Hood,  Jermaine Hood [place], [place] 08/03/2023, 9:21 AM

## 2023-08-06 DIAGNOSIS — F1120 Opioid dependence, uncomplicated: Secondary | ICD-10-CM | POA: Diagnosis not present

## 2023-08-13 DIAGNOSIS — F1120 Opioid dependence, uncomplicated: Secondary | ICD-10-CM | POA: Diagnosis not present

## 2023-08-13 NOTE — Progress Notes (Deleted)
 Psychiatric Initial Adult Assessment  Patient Identification: Jermaine Hood MRN:  [id number] Date of Evaluation:  08/13/2023 Referral Source: [facility]  Assessment:  Jermaine Hood is a 38 y.o. male with a history of MDD, alcohol use disorder, opioid use disorder, methamphetamine use disorder, heroin use disorder who presents in person to [facility] for initial mental health evaluation.  He was recently discharged after being brought in by the law enforcement agents for psychosis/paranoia.  He is currently being treated with methadone  as outpatient for opiate addiction.  He was started on Risperdal  1 mg nightly for psychotic symptoms.  Patient reports ***  Risk Assessment: A suicide and violence risk assessment was performed as part of this evaluation. There patient is deemed to be at chronic elevated risk for self-harm/suicide given the following factors: {SABSUICIDERISKFACTORS:29780}. These risk factors are mitigated by the following factors: {SABSUICIDEPROTECTIVEFACTORS:29779}. The patient is deemed to be at chronic elevated risk for violence given the following factors: {SABVIOLENCERISKFACTORS:29781}. These risk factors are mitigated by the following factors: {SABVIOLENCEPROTECTIVEFACTORS:29782}. There is no acute risk for suicide or violence at this time. The patient was educated about relevant modifiable risk factors including following recommendations for treatment of psychiatric illness and abstaining from substance abuse.  While future psychiatric events cannot be accurately predicted, the patient does not  currently require  acute inpatient psychiatric care and does not  currently meet [place]  involuntary commitment criteria.    Plan:  # *** Past medication trials:  Status of problem: *** Interventions: -- ***  # *** Past medication trials:  Status of problem: *** Interventions: -- ***  # *** Past medication trials:  Status  of problem: *** Interventions: -- ***  Return to care in 4 weeks  Patient was given contact information for behavioral health clinic and was instructed to call 911 for emergencies.    Patient and plan of care will be discussed with the Attending MD ,Dr. Sande, who agrees with the above statement and plan.   Subjective:  Chief Complaint: No chief complaint on file.   History of Present Illness:  ***  Past Psychiatric History:  Diagnoses: MDD, alcohol use disorder, opioid use disorder, methamphetamine use disorder, heroin use disorder Medication trials: Methadone , Risperdal  Previous psychiatrist/therapist: None Hospitalizations: Once in June 2025 Suicide attempts: Denies SIB: None Hx of violence towards others: None Current access to guns: Denies Hx of trauma/abuse: Denies  Substance Abuse History in the last 12 months:  Yes.    Past Medical History:  Past Medical History:  Diagnosis Date  . IV drug user     Past Surgical History:  Procedure Laterality Date  . APPENDECTOMY      Family Psychiatric History: ***  Family History: No family history on file.  Social History:   Academic/Vocational: *** Social History   Socioeconomic History  . Marital status: Single    Spouse name: Not on file  . Number of children: Not on file  . Years of education: Not on file  . Highest education level: Not on file  Occupational History  . Not on file  Tobacco Use  . Smoking status: Every Day    Current packs/day: 1.00    Average packs/day: 1 pack/day for 20.5 years (20.5 ttl pk-yrs)    Types: Cigarettes    Start date: 02/06/2003  . Smokeless tobacco: Never  . Tobacco comments:    cutting back  Substance and Sexual Activity  . Alcohol use: Yes    Comment:  rarely  . Drug use: Yes    Types: Marijuana, Methamphetamines, Fentanyl , Heroin  . Sexual activity: Yes    Birth control/protection: Condom  Other Topics Concern  . Not on file  Social History Narrative  . Not on  file   Social Drivers of Health   Financial Resource Strain: Not on file  Food Insecurity: Food Insecurity Present (07/30/2023)   Hunger Vital Sign   . Worried About Programme researcher, broadcasting/film/video in the Last Year: Never true   . Ran Out of Food in the Last Year: Sometimes true  Transportation Needs: No Transportation Needs (07/30/2023)   PRAPARE - Transportation   . Lack of Transportation (Medical): No   . Lack of Transportation (Non-Medical): No  Physical Activity: Not on file  Stress: Not on file  Social Connections: Unknown (11/09/2022)   Received from [facility]   Social Network   . Social Network: Not on file    Additional Social History: updated  Allergies:  No Known Allergies  Current Medications: Current Outpatient Medications  Medication Sig Dispense Refill  . methadone  (DOLOPHINE ) 10 MG/ML solution Take 70 mg by mouth daily.    . nicotine  (NICODERM CQ  - DOSED IN MG/24 HOURS) 14 mg/24hr patch Place 1 patch (14 mg total) onto the skin daily. (May buy from over the counter: For smoking cessation. 28 patch 0  . risperiDONE  (RISPERDAL ) 1 MG tablet Take 1 tablet (1 mg total) by mouth at bedtime. For mood control 30 tablet 0   No current facility-administered medications for this visit.    ROS: Review of Systems  Constitutional:  Negative for activity change, appetite change, chills, diaphoresis and fatigue.  HENT:  Negative for congestion, dental problem, drooling, ear discharge and ear pain.   Eyes:  Negative for pain, discharge and itching.  Respiratory:  Negative for apnea, cough, choking and chest tightness.   Cardiovascular:  Negative for chest pain, palpitations and leg swelling.  Gastrointestinal:  Negative for abdominal distention, abdominal pain, constipation, diarrhea and nausea.  Endocrine: Negative for cold intolerance and heat intolerance.  Genitourinary:  Negative for difficulty urinating, dysuria, flank pain, frequency, hematuria and urgency.  Musculoskeletal:   Negative for arthralgias, back pain, gait problem, joint swelling, myalgias and neck pain.  Skin:  Negative for color change and pallor.  Allergic/Immunologic: Negative for environmental allergies and food allergies.  Neurological:  Negative for dizziness, seizures, syncope, facial asymmetry, speech difficulty, light-headedness, numbness and headaches.  Psychiatric/Behavioral:  Negative for agitation, behavioral problems, confusion, decreased concentration, dysphoric mood, hallucinations, self-injury, sleep disturbance and suicidal ideas. The patient is not nervous/anxious and is not hyperactive.    ***  Objective:  Psychiatric Specialty Exam: There were no vitals taken for this visit.There is no height or weight on file to calculate BMI.  General Appearance: Casual  Eye Contact:  {BHH EYE CONTACT:22684}  Speech:  {Speech:22685}  Volume:  {Volume (PAA):22686}  Mood:  {BHH MOOD:22306}  Affect:  {Affect (PAA):22687}  Thought Content: {Thought Content:22690}   Suicidal Thoughts:  {ST/HT (PAA):22692}  Homicidal Thoughts:  {ST/HT (PAA):22692}  Thought Process:  {Thought Process (PAA):22688}  Orientation:  {BHH ORIENTATION (PAA):22689}    Memory: {BHH MEMORY:22881}  Judgment:  {Judgement (PAA):22694}  Insight:  {Insight (PAA):22695}  Concentration:  {Concentration:21399}  Recall:  not formally assessed ***  Fund of Knowledge: {BHH GOOD/FAIR/POOR:22877}  Language: {BHH GOOD/FAIR/POOR:22877}  Psychomotor Activity:  {Psychomotor (PAA):22696}  Akathisia:  {BHH YES OR NO:22294}  AIMS (if indicated): {Desc; done/not:10129}  Assets:  {Assets (PAA):22698}  ADL's:  {BHH JIO'D:77709}  Cognition: {chl bhh cognition:304700322}  Sleep:  {BHH GOOD/FAIR/POOR:22877}   PE: General: well-appearing; no acute distress Pulm: no increased work of breathing on room air  Strength & Muscle Tone: within normal limits Neuro: no focal neurological deficits observed  Gait & Station: normal  Metabolic  Disorder Labs: No results found for: HGBA1C, MPG No results found for: PROLACTIN No results found for: CHOL, TRIG, HDL, CHOLHDL, VLDL, LDLCALC No results found for: TSH  Therapeutic Level Labs: No results found for: LITHIUM No results found for: CBMZ No results found for: VALPROATE  Screenings:  AUDIT    Flowsheet Row [facility]  Alcohol Use Disorder Identification Test Final Score (AUDIT) 2   PHQ2-9    Flowsheet Row Video Visit from 08/18/2019 in Primary Care at [facility] Visit from 05/05/2018 in Primary Care at [facility] Visit from 06/26/2016 in Primary Care at [facility] Visit from 05/30/2016 in [facility] Care at [facility] Visit from 05/03/2016 in Primary Care at [facility]  PHQ-2 Total Score 0 0 0 0 0   Flowsheet Row Ad[facility]) [facility] from 07/29/2023 in [facility] ED from 02/16/2023 in [facility] Emergency Department at [facility]  C-SSRS RISK CATEGORY No Risk No Risk No Risk    Collaboration of Care: Collaboration of Care: Other Dr. Sande  Patient/Guardian was advised Release of Information must be obtained prior to any record release in order to collaborate their care with an outside provider. Patient/Guardian was advised if they have not already done so to contact the registration department to sign all necessary forms in order for us  to release information regarding their care.   Consent: Patient/Guardian gives verbal consent for treatment and assignment of benefits for services provided during this visit. Patient/Guardian expressed understanding and agreed to proceed.   Lavarius Doughten, MD 7/8/202511:20 AM

## 2023-08-21 ENCOUNTER — Ambulatory Visit (HOSPITAL_COMMUNITY)

## 2023-09-02 DIAGNOSIS — F1120 Opioid dependence, uncomplicated: Secondary | ICD-10-CM | POA: Diagnosis not present

## 2023-09-06 ENCOUNTER — Telehealth: Payer: Self-pay

## 2023-09-06 NOTE — Telephone Encounter (Signed)
 Copied from CRM [id number]. Topic: Appointments - Appointment Scheduling >> Sep 06, 2023  2:06 PM Pinkey ORN wrote: Patient/patient representative is calling to schedule an appointment. Refer to attachments for appointment information. >> Sep 06, 2023  2:12 PM Pinkey ORN wrote: Patient is requesting to be seen by Purcell Emil Schanz, MD , I advised patient that because it's been awhile the system has automatically generated him as a new patient. Patient is wanting a sooner appointment other then what's provided on my end, and is requesting a call back from the clinic in regards to scheduling. Patient call back number is [phone]

## 2023-09-09 DIAGNOSIS — F1120 Opioid dependence, uncomplicated: Secondary | ICD-10-CM | POA: Diagnosis not present

## 2023-09-12 NOTE — Telephone Encounter (Signed)
 Negative.  Not taking him as a new patient.

## 2023-09-12 NOTE — Telephone Encounter (Signed)
 Copied from CRM [id number]. Topic: Appointments - Scheduling Inquiry for Clinic >> Sep 12, 2023 12:34 PM Rea C wrote: Reason for CRM: Patient called in wanting to schedule an appointment with Dr. Purcell. Patient was an existing patient of his. However, he hasn't been to the office in a while. His last appointment was Dr. Purcell was a no show. I advised patient that he is populating as new patient and Dr. GORMAN at this time is not accepting patients. Patients is wondering if he may be able to be a patient of Dr.S's again.    [phone] (H)

## 2023-09-16 DIAGNOSIS — F1120 Opioid dependence, uncomplicated: Secondary | ICD-10-CM | POA: Diagnosis not present

## 2023-09-19 DIAGNOSIS — F411 Generalized anxiety disorder: Secondary | ICD-10-CM | POA: Diagnosis not present

## 2023-09-19 DIAGNOSIS — F19951 Other psychoactive substance use, unspecified with psychoactive substance-induced psychotic disorder with hallucinations: Secondary | ICD-10-CM | POA: Diagnosis not present

## 2023-09-19 DIAGNOSIS — F1190 Opioid use, unspecified, uncomplicated: Secondary | ICD-10-CM | POA: Diagnosis not present

## 2023-09-23 DIAGNOSIS — F1120 Opioid dependence, uncomplicated: Secondary | ICD-10-CM | POA: Diagnosis not present

## 2023-10-01 DIAGNOSIS — F19951 Other psychoactive substance use, unspecified with psychoactive substance-induced psychotic disorder with hallucinations: Secondary | ICD-10-CM | POA: Diagnosis not present

## 2023-10-01 DIAGNOSIS — F1190 Opioid use, unspecified, uncomplicated: Secondary | ICD-10-CM | POA: Diagnosis not present

## 2023-10-01 DIAGNOSIS — F411 Generalized anxiety disorder: Secondary | ICD-10-CM | POA: Diagnosis not present

## 2023-10-08 DIAGNOSIS — F1120 Opioid dependence, uncomplicated: Secondary | ICD-10-CM | POA: Diagnosis not present

## 2023-10-08 DIAGNOSIS — F1123 Opioid dependence with withdrawal: Secondary | ICD-10-CM | POA: Diagnosis not present

## 2023-10-10 DIAGNOSIS — F411 Generalized anxiety disorder: Secondary | ICD-10-CM | POA: Diagnosis not present

## 2023-10-10 DIAGNOSIS — F19951 Other psychoactive substance use, unspecified with psychoactive substance-induced psychotic disorder with hallucinations: Secondary | ICD-10-CM | POA: Diagnosis not present

## 2023-10-10 DIAGNOSIS — F1190 Opioid use, unspecified, uncomplicated: Secondary | ICD-10-CM | POA: Diagnosis not present

## 2023-11-04 DIAGNOSIS — F1520 Other stimulant dependence, uncomplicated: Secondary | ICD-10-CM | POA: Diagnosis not present

## 2023-11-14 ENCOUNTER — Ambulatory Visit (HOSPITAL_COMMUNITY): Admission: EM | Admit: 2023-11-14 | Discharge: 2023-11-14 | Disposition: A | Discharge status: Home / Self Care

## 2023-11-14 DIAGNOSIS — F1121 Opioid dependence, in remission: Secondary | ICD-10-CM

## 2023-11-14 NOTE — Discharge Instructions (Signed)

## 2023-11-14 NOTE — ED Provider Notes (Signed)
 Behavioral Health Urgent Care Medical Screening Exam  Patient Name: Jermaine Hood MRN: [id number] Date of Evaluation: 11/14/23 Chief Complaint: Involuntary commitment for bizarre behaviors at home per father.  Diagnosis:  Final diagnoses:  Opioid use disorder, severe, in early remission, on maintenance therapy, dependence (HCC)   History of Present illness: Jermaine Hood is a 38 y.o. male who has a history of opioid use disorder brought in under IVC status after a petition was entered for him for involuntary commitment by his father who for bizarre behaviors.  Assessment: During encounter with patient, he is oriented to person, place, time and situation. Respondent is found during encounter to be rational and logical, speech is clear & coherent, ideas are chronologically arranged, there is no overt evidence of psychosis, and patient denies SI, denies HI, denies auditory or visual hallucinations. Patient responds to questions in a linear manner, and findings are inconsistent with allegations in the IVC documentation.   Father called at [phone] for collateral information, and call not answered, and goes not VM, no message left. Respondent states that since being out of rehab close to two weeks ago, he has not used any recreational substances, and is on MAT therapy. He does not want to be on any psychotropic medications at this time even though he admits to a history of MDD. Writer completed first examination, and recommending releasing respondent from [facility], as he is agreeing to continuing mental health care on an outpatient basis. Current symptoms do not render him or any one else at a risk of danger.  Nursing has been notified to send patient home in a cab.  Patient has stated that he resides by himself, and at home which he owns, and at the address on file, and this is where he will be sent.  Patient is calm and cooperative during encounter, denies depressive symptoms, denies symptoms  consistent with anxiety, states it is not my job to try to convince people of what I believe everybody is perception of reality is different, but I do not believe an error anything weird like that, and I do not believe that I am being watched by some weird birds.  This is in response to being asked some of the questions that on his IVC petition.  There is no evidence of paranoia during encounter with him.  Suicide weeks at this time is also very minimal based on responses gotten from him when questions related to suicidal ideations when asked.  Flowsheet Row ED from 11/14/2023 in [facility] Admission (Discharged) [facility] from 07[facility]29/2023 in [facility] Emergency Department at [facility]  C-SSRS RISK CATEGORY No Risk No Risk No Risk    Psychiatric Specialty Exam  Presentation  General Appearance:Fairly Groomed  Eye Contact:Fair  Speech:Clear and Coherent  Speech Volume:Normal  Handedness:Right   Mood and Affect  Mood: Euthymic  Affect: Congruent   Thought Process  Thought Processes: Coherent  Descriptions of Associations:Intact  Orientation:Full (Time, Place and Person)  Thought Content:Logical    Hallucinations:None  Ideas of Reference:None  Suicidal Thoughts:No  Homicidal Thoughts:No   Sensorium  Memory: Immediate Fair  Judgment: Fair  Insight: Fair   Art therapist  Concentration: Fair  Attention Span: Fair  Recall: [employer] of Knowledge: Fair  Language: Fair   Psychomotor Activity  Psychomotor Activity: Normal   Assets  Assets: Resilience   Sleep  Sleep: Fair  Number of hours:  8   Physical Exam: Physical Exam Vitals and nursing note reviewed.  Neurological:     General: No focal deficit present.     Mental Status: He is oriented to person, place, and time.  Psychiatric:        Behavior: Behavior normal.         Judgment: Judgment normal.    Review of Systems  Psychiatric/Behavioral:  Positive for depression and substance abuse. Negative for hallucinations, memory loss and suicidal ideas. The patient is not nervous/anxious and does not have insomnia.   All other systems reviewed and are negative.  Blood pressure 128/77, pulse 75, resp. rate 16, SpO2 99%. There is no height or weight on file to calculate BMI.  Musculoskeletal: Strength & Muscle Tone: within normal limits Gait & Station: normal Patient leans: N/A   [facility] MSE Discharge Disposition for Follow up and Recommendations: Based on my evaluation the patient does not appear to have an emergency medical condition and can be discharged with resources and follow up care in outpatient services for Medication Management and Individual Therapy  Follow up with [facility] Res[facility] Only  Walk-in hours for open access (medication management and therapy) are Monday - Friday 8 am to 11 am. Appointments are limited, so please arrive at 7:00 am. Upon arrival, please complete the form on the clipboard located at the front desk. If there are no clipboards available, all appointments have been filled for that day.  [facility] [address] [place]  [phone]  Donia Snell, NP 11/14/2023, 7:29 PM

## 2023-11-14 NOTE — Progress Notes (Signed)
   11/14/23 1710  [facility] Triage Screening (Walk-ins at [facility] only)  What Is the Reason for Your Visit/Call Today? Jermaine Hood 38y male arrived to [facility] under IVC. Per pt, he is diagnosed with depression, anxiety and was addicted to opioids (was taking suboxone); pt takes meds prn. PT denies SI, HI, AVH and alcohol use. PT reports to smoking 1 bowl of marijuana yesterday. PT states that his parents are manipulating and controlling; he feels their intentions are rooted in good but their current methods are toxic and not beneficial for his recovery. PT states his parents want him to recovery on their terms and not his own. PT reports that he has been clean for about 12 days (opioid use). PT reports while using drugs he has said some reckless / disturbing things but it was only due to it being drug induced.  Have You Recently Had Any Thoughts About Hurting Yourself? No  Are You Planning to Commit Suicide/Harm Yourself At This time? No  Have you Recently Had Thoughts About Hurting Someone [place]? No  Are You Planning To Harm Someone At This Time? No  Physical Abuse Denies  Verbal Abuse Yes, present (Comment)  Sexual Abuse Denies  Exploitation of patient/patient's resources Denies  Self-Neglect Denies  Are you currently experiencing any auditory, visual or other hallucinations? No  Have You Used Any Alcohol or Drugs in the Past 24 Hours? Yes  What Did You Use and How Much? marijuana (smoked 1 bowl)  Do you have any current medical co-morbidities that require immediate attention? No  Clinician description of patient physical appearance/behavior: cooperative, mild paranoia  What Do You Feel Would Help You the Most Today? Alcohol or Drug Use Treatment;Medication(s);Stress Management;Social Support  Determination of Need Routine (7 days)  Options For Referral [facility];Intensive Outpatient Therapy;Facility-Based Crisis

## 2023-11-28 ENCOUNTER — Ambulatory Visit (HOSPITAL_COMMUNITY)
Admission: EM | Admit: 2023-11-28 | Discharge: 2023-11-29 | Disposition: A | Discharge status: Other Acute Inpatient Hospital | Attending: Nurse Practitioner | Admitting: Nurse Practitioner

## 2023-11-28 DIAGNOSIS — Z79891 Long term (current) use of opiate analgesic: Secondary | ICD-10-CM | POA: Insufficient documentation

## 2023-11-28 DIAGNOSIS — I447 Left bundle-branch block, unspecified: Secondary | ICD-10-CM | POA: Diagnosis not present

## 2023-11-28 DIAGNOSIS — B182 Chronic viral hepatitis C: Secondary | ICD-10-CM | POA: Insufficient documentation

## 2023-11-28 DIAGNOSIS — F333 Major depressive disorder, recurrent, severe with psychotic symptoms: Secondary | ICD-10-CM | POA: Insufficient documentation

## 2023-11-28 DIAGNOSIS — K7400 Hepatic fibrosis, unspecified: Secondary | ICD-10-CM | POA: Insufficient documentation

## 2023-11-28 DIAGNOSIS — R4584 Anhedonia: Secondary | ICD-10-CM | POA: Diagnosis not present

## 2023-11-28 DIAGNOSIS — F1910 Other psychoactive substance abuse, uncomplicated: Secondary | ICD-10-CM | POA: Insufficient documentation

## 2023-11-28 DIAGNOSIS — F15959 Other stimulant use, unspecified with stimulant-induced psychotic disorder, unspecified: Secondary | ICD-10-CM | POA: Diagnosis present

## 2023-11-28 DIAGNOSIS — F1524 Other stimulant dependence with stimulant-induced mood disorder: Secondary | ICD-10-CM | POA: Insufficient documentation

## 2023-11-28 DIAGNOSIS — F1520 Other stimulant dependence, uncomplicated: Secondary | ICD-10-CM | POA: Diagnosis not present

## 2023-11-28 DIAGNOSIS — Z79899 Other long term (current) drug therapy: Secondary | ICD-10-CM | POA: Diagnosis not present

## 2023-11-28 DIAGNOSIS — G4700 Insomnia, unspecified: Secondary | ICD-10-CM | POA: Diagnosis not present

## 2023-11-28 MED ORDER — DIPHENHYDRAMINE HCL 50 MG/ML IJ SOLN: 50.0000 mg | Freq: Three times a day (TID) | INTRAMUSCULAR | Status: DC | PRN

## 2023-11-28 MED ORDER — LORAZEPAM 2 MG/ML IJ SOLN: 2.0000 mg | Freq: Three times a day (TID) | INTRAMUSCULAR | Status: DC | PRN

## 2023-11-28 MED ORDER — HALOPERIDOL LACTATE 5 MG/ML IJ SOLN: 10.0000 mg | Freq: Three times a day (TID) | INTRAMUSCULAR | Status: DC | PRN

## 2023-11-28 MED ORDER — MAGNESIUM HYDROXIDE 400 MG/5ML PO SUSP: 30.0000 mL | Freq: Every day | ORAL | Status: DC | PRN

## 2023-11-28 MED ORDER — HALOPERIDOL LACTATE 5 MG/ML IJ SOLN: 5.0000 mg | Freq: Three times a day (TID) | INTRAMUSCULAR | Status: DC | PRN

## 2023-11-28 MED ORDER — TRAZODONE HCL 50 MG PO TABS
50.0000 mg | ORAL_TABLET | Freq: Every evening | ORAL | Status: DC | PRN
  Administered 2023-11-28: 50 mg via ORAL
  Filled 2023-11-28: qty 1

## 2023-11-28 MED ORDER — BUPROPION HCL ER (XL) 150 MG PO TB24
150.0000 mg | ORAL_TABLET | Freq: Every day | ORAL | Status: DC
  Administered 2023-11-28 – 2023-11-29 (×2): 150 mg via ORAL
  Filled 2023-11-28 (×2): qty 1

## 2023-11-28 MED ORDER — HALOPERIDOL 5 MG PO TABS: 5.0000 mg | ORAL_TABLET | Freq: Three times a day (TID) | ORAL | Status: DC | PRN

## 2023-11-28 MED ORDER — ALUM & MAG HYDROXIDE-SIMETH 200-200-20 MG/5ML PO SUSP: 30.0000 mL | ORAL | Status: DC | PRN

## 2023-11-28 MED ORDER — ACETAMINOPHEN 325 MG PO TABS: 650.0000 mg | ORAL_TABLET | Freq: Four times a day (QID) | ORAL | Status: DC | PRN

## 2023-11-28 MED ORDER — ARIPIPRAZOLE 5 MG PO TABS
5.0000 mg | ORAL_TABLET | Freq: Every day | ORAL | Status: DC
  Administered 2023-11-28: 5 mg via ORAL
  Filled 2023-11-28: qty 1

## 2023-11-28 MED ORDER — HYDROXYZINE HCL 25 MG PO TABS: 25.0000 mg | ORAL_TABLET | Freq: Three times a day (TID) | ORAL | Status: DC | PRN

## 2023-11-28 MED ORDER — DIPHENHYDRAMINE HCL 50 MG PO CAPS: 50.0000 mg | ORAL_CAPSULE | Freq: Three times a day (TID) | ORAL | Status: DC | PRN

## 2023-11-28 NOTE — ED Notes (Signed)
Pt sleeping at present, respirations even & unlabored.  Monitoring for safety. 
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
## 2023-11-28 NOTE — ED Notes (Signed)
 Pt sleeping. Pt observed/assessed in recliner sleeping. RR even and unlabored, appearing in no noted distress. Environmental check complete, will continue to monitor for safety
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
## 2023-11-28 NOTE — ED Notes (Addendum)
 Pt bought in by [facility] IVC by ex-girlfriend for paranoid and aggressive behaviors. Pt reports, My girlfriend is a narcissist and we broke up last night. She turned out to be a fraud. Plus, I feel that the government is against me. I can't say too much because that would incriminate me. I have magnetic rays going thru my body that was implanted by the government. I am the matrix but I know you don't believe me. When I close my eyes, I see squiggly rays and things that abnormal people don't see but you wouldn't understand that. Pt reports using meth intravenous 2 days ago. Pt easily agitated but non-aggressive. Writer able to complete skin assessment. Unable to obtain blood specimen due to poor venous access. Pt encouraged to drink more fluids. Oriented to unit. Meal and drink offered. Pt denies SI/HI/AVH. Pt verbally contract for safety. Will monitor for safety.

## 2023-11-29 ENCOUNTER — Ambulatory Visit (HOSPITAL_COMMUNITY)
Admission: EM | Admit: 2023-11-29 | Discharge: 2023-11-29 | Disposition: A | Discharge status: Other Acute Inpatient Hospital

## 2023-11-29 ENCOUNTER — Inpatient Hospital Stay (HOSPITAL_COMMUNITY)
Admission: AD | Admit: 2023-11-29 | Discharge: 2023-12-03 | DRG: 897 | Disposition: A | Discharge status: Home / Self Care | Source: Other Acute Inpatient Hospital

## 2023-11-29 DIAGNOSIS — F17210 Nicotine dependence, cigarettes, uncomplicated: Secondary | ICD-10-CM | POA: Diagnosis present

## 2023-11-29 DIAGNOSIS — F1914 Other psychoactive substance abuse with psychoactive substance-induced mood disorder: Secondary | ICD-10-CM | POA: Diagnosis not present

## 2023-11-29 DIAGNOSIS — G4700 Insomnia, unspecified: Secondary | ICD-10-CM | POA: Diagnosis present

## 2023-11-29 DIAGNOSIS — R45851 Suicidal ideations: Secondary | ICD-10-CM | POA: Diagnosis present

## 2023-11-29 DIAGNOSIS — F15159 Other stimulant abuse with stimulant-induced psychotic disorder, unspecified: Principal | ICD-10-CM | POA: Diagnosis present

## 2023-11-29 DIAGNOSIS — F1121 Opioid dependence, in remission: Secondary | ICD-10-CM | POA: Diagnosis not present

## 2023-11-29 DIAGNOSIS — F329 Major depressive disorder, single episode, unspecified: Secondary | ICD-10-CM | POA: Diagnosis not present

## 2023-11-29 DIAGNOSIS — F1510 Other stimulant abuse, uncomplicated: Principal | ICD-10-CM | POA: Diagnosis present

## 2023-11-29 DIAGNOSIS — Z79899 Other long term (current) drug therapy: Secondary | ICD-10-CM

## 2023-11-29 DIAGNOSIS — F333 Major depressive disorder, recurrent, severe with psychotic symptoms: Secondary | ICD-10-CM | POA: Insufficient documentation

## 2023-11-29 DIAGNOSIS — Z733 Stress, not elsewhere classified: Secondary | ICD-10-CM | POA: Diagnosis not present

## 2023-11-29 DIAGNOSIS — F1524 Other stimulant dependence with stimulant-induced mood disorder: Secondary | ICD-10-CM | POA: Diagnosis not present

## 2023-11-29 DIAGNOSIS — F15959 Other stimulant use, unspecified with stimulant-induced psychotic disorder, unspecified: Principal | ICD-10-CM | POA: Diagnosis present

## 2023-11-29 LAB — POCT URINE DRUG SCREEN - MANUAL ENTRY (I-SCREEN)
POC Amphetamine UR: POSITIVE — AB
POC Buprenorphine (BUP): NOT DETECTED
POC Cocaine UR: NOT DETECTED
POC Marijuana UR: POSITIVE — AB
POC Methadone UR: NOT DETECTED
POC Methamphetamine UR: POSITIVE — AB
POC Morphine: NOT DETECTED
POC Oxazepam (BZO): POSITIVE — AB
POC Oxycodone UR: NOT DETECTED
POC Secobarbital (BAR): NOT DETECTED

## 2023-11-29 LAB — COMPREHENSIVE METABOLIC PANEL WITH GFR
ALT: 10 U/L (ref 0–44)
AST: 20 U/L (ref 15–41)
Albumin: 3.9 g/dL (ref 3.5–5.0)
Alkaline Phosphatase: 55 U/L (ref 38–126)
Anion gap: 15 (ref 5–15)
BUN: 13 mg/dL (ref 6–20)
CO2: 22 mmol/L (ref 22–32)
Calcium: 9.9 mg/dL (ref 8.9–10.3)
Chloride: 100 mmol/L (ref 98–111)
Creatinine, Ser: 0.91 mg/dL (ref 0.61–1.24)
GFR, Estimated: >60 mL/min (ref >60)
Glucose, Bld: 107 mg/dL — ABNORMAL HIGH (ref 70–99)
Potassium: 4 mmol/L (ref 3.5–5.1)
Sodium: 137 mmol/L (ref 135–145)
Total Bilirubin: <0.2 mg/dL (ref 0.0–1.2)
Total Protein: 7.3 g/dL (ref 6.5–8.1)

## 2023-11-29 LAB — CBC WITH DIFFERENTIAL/PLATELET
Abs Immature Granulocytes: 0.01 K/uL (ref 0.00–0.07)
Basophils Absolute: 0 K/uL (ref 0.0–0.1)
Basophils Relative: 0 %
Eosinophils Absolute: 0.1 K/uL (ref 0.0–0.5)
Eosinophils Relative: 2 %
HCT: 46 % (ref 39.0–52.0)
Hemoglobin: 15.1 g/dL (ref 13.0–17.0)
Immature Granulocytes: 0 %
Lymphocytes Relative: 29 %
Lymphs Abs: 1.8 K/uL (ref 0.7–4.0)
MCH: 28.2 pg (ref 26.0–34.0)
MCHC: 32.8 g/dL (ref 30.0–36.0)
MCV: 86 fL (ref 80.0–100.0)
Monocytes Absolute: 0.4 K/uL (ref 0.1–1.0)
Monocytes Relative: 6 %
Neutro Abs: 3.9 K/uL (ref 1.7–7.7)
Neutrophils Relative %: 63 %
Platelets: 201 K/uL (ref 150–400)
RBC: 5.35 MIL/uL (ref 4.22–5.81)
RDW: 13.5 % (ref 11.5–15.5)
WBC: 6.2 K/uL (ref 4.0–10.5)
nRBC: 0 % (ref 0.0–0.2)

## 2023-11-29 LAB — URINALYSIS, ROUTINE W REFLEX MICROSCOPIC
Bacteria, UA: NONE SEEN
Bilirubin Urine: NEGATIVE
Glucose, UA: NEGATIVE mg/dL
Hgb urine dipstick: NEGATIVE
Ketones, ur: 5 mg/dL — AB
Nitrite: NEGATIVE
Protein, ur: NEGATIVE mg/dL
Specific Gravity, Urine: 1.017 (ref 1.005–1.030)
pH: 6 (ref 5.0–8.0)

## 2023-11-29 LAB — TSH: TSH: 0.163 u[IU]/mL — ABNORMAL LOW (ref 0.350–4.500)

## 2023-11-29 LAB — LIPID PANEL
Cholesterol: 147 mg/dL (ref 0–200)
HDL: 50 mg/dL (ref >40)
LDL Cholesterol: 84 mg/dL (ref 0–99)
Total CHOL/HDL Ratio: 2.9 ratio
Triglycerides: 64 mg/dL (ref <150)
VLDL: 13 mg/dL (ref 0–40)

## 2023-11-29 LAB — HEMOGLOBIN A1C
Hgb A1c MFr Bld: 4.9 % (ref 4.8–5.6)
Mean Plasma Glucose: 93.93 mg/dL

## 2023-11-29 LAB — MAGNESIUM: Magnesium: 2.1 mg/dL (ref 1.7–2.4)

## 2023-11-29 LAB — ETHANOL: Alcohol, Ethyl (B): <15 mg/dL (ref <15)

## 2023-11-29 MED ORDER — MAGNESIUM HYDROXIDE 400 MG/5ML PO SUSP: 30.0000 mL | Freq: Every day | ORAL | Status: DC | PRN

## 2023-11-29 MED ORDER — ACETAMINOPHEN 325 MG PO TABS: 650.0000 mg | ORAL_TABLET | Freq: Four times a day (QID) | ORAL | Status: DC | PRN

## 2023-11-29 MED ORDER — DIPHENHYDRAMINE HCL 50 MG/ML IJ SOLN
50.0000 mg | Freq: Three times a day (TID) | INTRAMUSCULAR | Status: DC | PRN
Start: 2023-11-29 — End: 2023-11-29

## 2023-11-29 MED ORDER — HALOPERIDOL LACTATE 5 MG/ML IJ SOLN: 5.0000 mg | Freq: Three times a day (TID) | INTRAMUSCULAR | Status: DC | PRN

## 2023-11-29 MED ORDER — NICOTINE 21 MG/24HR TD PT24
21.0000 mg | MEDICATED_PATCH | Freq: Every day | TRANSDERMAL | Status: DC
  Administered 2023-11-29: 21 mg via TRANSDERMAL
  Filled 2023-11-29: qty 1

## 2023-11-29 MED ORDER — HALOPERIDOL 5 MG PO TABS
5.0000 mg | ORAL_TABLET | Freq: Three times a day (TID) | ORAL | Status: DC | PRN
Start: 2023-11-29 — End: 2023-11-29

## 2023-11-29 MED ORDER — ARIPIPRAZOLE 5 MG PO TABS
5.0000 mg | ORAL_TABLET | Freq: Every day | ORAL | Status: DC
  Administered 2023-11-29: 5 mg via ORAL
  Filled 2023-11-29: qty 1

## 2023-11-29 MED ORDER — HYDROXYZINE HCL 25 MG PO TABS: 25.0000 mg | ORAL_TABLET | Freq: Three times a day (TID) | ORAL | Status: DC | PRN

## 2023-11-29 MED ORDER — DIPHENHYDRAMINE HCL 50 MG PO CAPS: 50.0000 mg | ORAL_CAPSULE | Freq: Three times a day (TID) | ORAL | Status: DC | PRN

## 2023-11-29 MED ORDER — LORAZEPAM 2 MG/ML IJ SOLN: 2.0000 mg | Freq: Three times a day (TID) | INTRAMUSCULAR | Status: DC | PRN

## 2023-11-29 MED ORDER — HALOPERIDOL LACTATE 5 MG/ML IJ SOLN
10.0000 mg | Freq: Three times a day (TID) | INTRAMUSCULAR | Status: DC | PRN
Start: 2023-11-29 — End: 2023-11-29

## 2023-11-29 MED ORDER — TRAZODONE HCL 50 MG PO TABS
50.0000 mg | ORAL_TABLET | Freq: Every evening | ORAL | Status: DC | PRN
Start: 2023-11-29 — End: 2023-11-29
  Administered 2023-11-29: 50 mg via ORAL
  Filled 2023-11-29: qty 1

## 2023-11-29 MED ORDER — BUPROPION HCL ER (XL) 150 MG PO TB24
150.0000 mg | ORAL_TABLET | Freq: Every day | ORAL | Status: DC
Start: 2023-11-30 — End: 2023-11-29

## 2023-11-29 MED ORDER — DIPHENHYDRAMINE HCL 50 MG/ML IJ SOLN: 50.0000 mg | Freq: Three times a day (TID) | INTRAMUSCULAR | Status: DC | PRN

## 2023-11-29 MED ORDER — ALUM & MAG HYDROXIDE-SIMETH 200-200-20 MG/5ML PO SUSP: 30.0000 mL | ORAL | Status: DC | PRN

## 2023-11-29 NOTE — ED Notes (Signed)
 Report called to [facility] RN, [facility]

## 2023-11-29 NOTE — ED Notes (Signed)
 Patient is lying in bed sleeping, respirations are even and unlabored, will continue to monitor patient for safety.

## 2023-11-29 NOTE — ED Provider Notes (Signed)
 Behavioral Health Progress Note  Date and Time: 11/29/2023 2:50 PM Name: Jermaine Hood MRN:  [id number]  Subjective:    Jermaine Hood is a 38 y.o. male with a prior mental health history of polysubstance abuse including methamphetamines, opioids, & THC who presents to this behavioral health center accompanied by law enforcement after a petition for involuntary commitment was filed by his girlfriend for patient making suicidal statements, and worsening substance abuse with paranoia.    On morning assessment, patient was interviewed on the observation unit.  Patient was cooperative, appropriate and engaged in conversation with provider.  Patient was linear in thought process and more organized compared to documentation on initial intake.  Patient denies current suicidal ideations, homicidal ideation or auditory visual hallucinations.  Patient reports that he got into an argument with his girlfriend yesterday, because she wanted to come by the house and he wanted to sleep due to not being well rested due to meth use.  Patient reports that she got frustrated and had papers committed on him.  Reports that relationship, ended however continues to be toxic.  He does report that he uses substances to help with mood symptoms.  Patient feels as if he has a lack of control and that the past year has been one of the worst of his life.  He reports that previously he was able to stay sober for at least 3-1/2 years off substances, when he was really focused on his job at work.  Patient reports that he would be willing to consider intensive outpatient resources to help address polysubstance use.  He denies any history of prior suicide attempts or mental illness throughout his life.  Denies any history of violence or pending charges.   Diagnosis:  Final diagnoses:  Methamphetamine dependence (HCC)  MDD (major depressive disorder), recurrent, severe, with psychosis (HCC)    Total Time spent with patient: 30  minutes  Per EMR  Past Psychiatric History:, Major depressive disorder, polysubstance abuse, cannabis use, opioid use disorder, methamphetamine use disorder, stimulant induced psychotic disorder Past Medical History: Chronic hepatitis C, liver fibrosis, history of HSV Family History: No pertinent psych history disclosed. Family Psychiatric History:  Patient denies any familia hx of mental illnesses.  Social History:  Patient reports being single, has no children, lives in [place], [place], unemployed.  Additional Social History:    Pain Medications: See MAR Prescriptions: See MAR Over the Counter: See MAR History of alcohol / drug use?: Yes Longest period of sobriety (when/how long): several weeks - earlier this year Negative Consequences of Use: Financial, Personal relationships Withdrawal Symptoms: Irritability Name of Substance 1: Methamphetamines 1 - Age of First Use: unknown 1 - Amount (size/oz): 1/2 gram or more 1 - Frequency: daily 1 - Duration: unclear, inconsistent reports - at one point states he relapsed a couple of weeks ago and then states he only used this past Monday and Tuesday 1 - Last Use / Amount: Tues - 1/2 gram 1 - Method of Aquiring: buys 1- Route of Use: unknown                  Sleep: Good  Appetite:  Good  Current Medications:  Current Facility-Administered Medications  Medication Dose Route Frequency Provider Last Rate Last Admin   acetaminophen  (TYLENOL ) tablet 650 mg  650 mg Oral Q6H PRN Nkwenti, Doris, NP       alum & mag hydroxide-simeth (MAALOX/MYLANTA) 200-200-20 MG/5ML suspension 30 mL  30 mL Oral Q4H PRN Tex Drilling,  NP       ARIPiprazole (ABILIFY) tablet 5 mg  5 mg Oral QHS Nkwenti, Doris, NP   5 mg at 11/28/23 2142   buPROPion (WELLBUTRIN XL) 24 hr tablet 150 mg  150 mg Oral Daily Nkwenti, Doris, NP   150 mg at 11/29/23 9187   haloperidol  (HALDOL ) tablet 5 mg  5 mg Oral TID PRN Tex Drilling, NP       And   diphenhydrAMINE   (BENADRYL ) capsule 50 mg  50 mg Oral TID PRN Tex Drilling, NP       haloperidol  lactate (HALDOL ) injection 5 mg  5 mg Intramuscular TID PRN Tex Drilling, NP       And   diphenhydrAMINE  (BENADRYL ) injection 50 mg  50 mg Intramuscular TID PRN Tex Drilling, NP       And   LORazepam  (ATIVAN ) injection 2 mg  2 mg Intramuscular TID PRN Tex Drilling, NP       haloperidol  lactate (HALDOL ) injection 10 mg  10 mg Intramuscular TID PRN Tex Drilling, NP       And   diphenhydrAMINE  (BENADRYL ) injection 50 mg  50 mg Intramuscular TID PRN Tex Drilling, NP       And   LORazepam  (ATIVAN ) injection 2 mg  2 mg Intramuscular TID PRN Tex Drilling, NP       hydrOXYzine  (ATARAX ) tablet 25 mg  25 mg Oral TID PRN Tex Drilling, NP       magnesium  hydroxide (MILK OF MAGNESIA) suspension 30 mL  30 mL Oral Daily PRN Tex Drilling, NP       nicotine  (NICODERM CQ  - dosed in mg/24 hours) patch 21 mg  21 mg Transdermal Daily Lenard Calin, MD   21 mg at 11/29/23 1336   traZODone  (DESYREL ) tablet 50 mg  50 mg Oral QHS PRN Tex Drilling, NP   50 mg at 11/28/23 2142   Current Outpatient Medications  Medication Sig Dispense Refill   buprenorphine-naloxone  (SUBOXONE) 8-2 mg SUBL SL tablet Place 0.5 tablets under the tongue 4 (four) times daily as needed (For opioid use disorder).     hydrOXYzine  (ATARAX ) 25 MG tablet Take 25 mg by mouth 3 (three) times daily as needed for anxiety.     naloxone  (NARCAN ) nasal spray 4 mg/0.1 mL Place 1 spray into the nose once as needed (For opioid overdose).     tiZANidine (ZANAFLEX) 4 MG tablet Take 4 mg by mouth every 6 (six) hours as needed for muscle spasms.     risperiDONE  (RISPERDAL ) 1 MG tablet Take 1 tablet (1 mg total) by mouth at bedtime. For mood control (Patient not taking: Reported on 11/29/2023) 30 tablet 0    Labs  Lab Results:  Admission on 11/28/2023  Component Date Value Ref Range Status   Color, Urine 11/29/2023 YELLOW  YELLOW Final   APPearance  11/29/2023 HAZY (A)  CLEAR Final   Specific Gravity, Urine 11/29/2023 1.017  1.005 - 1.030 Final   pH 11/29/2023 6.0  5.0 - 8.0 Final   Glucose, UA 11/29/2023 NEGATIVE  NEGATIVE mg/dL Final   Hgb urine dipstick 11/29/2023 NEGATIVE  NEGATIVE Final   Bilirubin Urine 11/29/2023 NEGATIVE  NEGATIVE Final   Ketones, ur 11/29/2023 5 (A)  NEGATIVE mg/dL Final   Protein, ur 89/75/7974 NEGATIVE  NEGATIVE mg/dL Final   Nitrite 89/75/7974 NEGATIVE  NEGATIVE Final   Leukocytes,Ua 11/29/2023 TRACE (A)  NEGATIVE Final   RBC / HPF 11/29/2023 0-5  0 - 5 RBC/hpf Final   WBC,  UA 11/29/2023 0-5  0 - 5 WBC/hpf Final   Bacteria, UA 11/29/2023 NONE SEEN  NONE SEEN Final   Squamous Epithelial / HPF 11/29/2023 0-5  0 - 5 /HPF Final   Mucus 11/29/2023 PRESENT   Final   Performed at [facility], [address], [place], [place] [address]   POC Amphetamine UR 11/29/2023 Positive (A)  NONE DETECTED (Cut Off Level 1000 ng/mL) Final   POC Secobarbital (BAR) 11/29/2023 None Detected  NONE DETECTED (Cut Off Level 300 ng/mL) Final   POC Buprenorphine (BUP) 11/29/2023 None Detected  NONE DETECTED (Cut Off Level 10 ng/mL) Final   POC Oxazepam (BZO) 11/29/2023 Positive (A)  NONE DETECTED (Cut Off Level 300 ng/mL) Final   POC Cocaine UR 11/29/2023 None Detected  NONE DETECTED (Cut Off Level 300 ng/mL) Final   POC Methamphetamine UR 11/29/2023 Positive (A)  NONE DETECTED (Cut Off Level 1000 ng/mL) Final   POC Morphine 11/29/2023 None Detected  NONE DETECTED (Cut Off Level 300 ng/mL) Final   POC Methadone  UR 11/29/2023 None Detected  NONE DETECTED (Cut Off Level 300 ng/mL) Final   POC Oxycodone  UR 11/29/2023 None Detected  NONE DETECTED (Cut Off Level 100 ng/mL) Final   POC Marijuana UR 11/29/2023 Positive (A)  NONE DETECTED (Cut Off Level 50 ng/mL) Final  Admission on 07/29/2023, Discharged on 07/30/2023  Component Date Value Ref Range Status   WBC 07/29/2023 6.3  4.0 - 10.5 K/uL Final   RBC 07/29/2023 4.92  4.22 -  5.81 MIL/uL Final   Hemoglobin 07/29/2023 12.9 (L)  13.0 - 17.0 g/dL Final   HCT 93/76/7974 40.1  39.0 - 52.0 % Final   MCV 07/29/2023 81.5  80.0 - 100.0 fL Final   MCH 07/29/2023 26.2  26.0 - 34.0 pg Final   MCHC 07/29/2023 32.2  30.0 - 36.0 g/dL Final   RDW 93/76/7974 13.5  11.5 - 15.5 % Final   Platelets 07/29/2023 291  150 - 400 K/uL Final   nRBC 07/29/2023 0.0  0.0 - 0.2 % Final   Neutrophils Relative % 07/29/2023 52  % Final   Neutro Abs 07/29/2023 3.3  1.7 - 7.7 K/uL Final   Lymphocytes Relative 07/29/2023 39  % Final   Lymphs Abs 07/29/2023 2.4  0.7 - 4.0 K/uL Final   Monocytes Relative 07/29/2023 5  % Final   Monocytes Absolute 07/29/2023 0.3  0.1 - 1.0 K/uL Final   Eosinophils Relative 07/29/2023 3  % Final   Eosinophils Absolute 07/29/2023 0.2  0.0 - 0.5 K/uL Final   Basophils Relative 07/29/2023 1  % Final   Basophils Absolute 07/29/2023 0.0  0.0 - 0.1 K/uL Final   Immature Granulocytes 07/29/2023 0  % Final   Abs Immature Granulocytes 07/29/2023 0.01  0.00 - 0.07 K/uL Final   Performed at [facility], [address], [place], [place] [address]   Sodium 07/29/2023 137  135 - 145 mmol/L Final   Potassium 07/29/2023 3.6  3.5 - 5.1 mmol/L Final   Chloride 07/29/2023 100  98 - 111 mmol/L Final   CO2 07/29/2023 28  22 - 32 mmol/L Final   Glucose, Bld 07/29/2023 106 (H)  70 - 99 mg/dL Final   Glucose reference range applies only to samples taken after fasting for at least 8 hours.   BUN 07/29/2023 17  6 - 20 mg/dL Final   Creatinine, Ser 07/29/2023 0.91  0.61 - 1.24 mg/dL Final   Calcium 93/76/7974 9.6  8.9 -  10.3 mg/dL Final   Total Protein 93/76/7974 7.9  6.5 - 8.1 g/dL Final   Albumin 93/76/7974 4.1  3.5 - 5.0 g/dL Final   AST 93/76/7974 25  15 - 41 U/L Final   ALT 07/29/2023 17  0 - 44 U/L Final   Alkaline Phosphatase 07/29/2023 72  38 - 126 U/L Final   Total Bilirubin 07/29/2023 0.5  0.0 - 1.2 mg/dL Final   GFR, Estimated 07/29/2023 >60  >60 mL/min  Final   Comment: (NOTE) Calculated using the CKD-EPI Creatinine Equation (2021)    Anion gap 07/29/2023 9  5 - 15 Final   Performed at [facility], [address] [place], [place] [address]   Alcohol, Ethyl (B) 07/29/2023 <15  <15 mg/dL Final   Comment: (NOTE) For medical purposes only. Performed at [facility], [address] [place], [place] [address]    Opiates 07/29/2023 NONE DETECTED  NONE DETECTED Final   Cocaine 07/29/2023 NONE DETECTED  NONE DETECTED Final   Benzodiazepines 07/29/2023 NONE DETECTED  NONE DETECTED Final   Amphetamines 07/29/2023 POSITIVE (A)  NONE DETECTED Final   Comment: (NOTE) Trazodone  is metabolized in vivo to several metabolites, including pharmacologically active m-CPP, which is excreted in the urine. Immunoassay screens for amphetamines and MDMA have potential cross-reactivity with these compounds and may provide false positive  results.     Tetrahydrocannabinol 07/29/2023 POSITIVE (A)  NONE DETECTED Final   Barbiturates 07/29/2023 NONE DETECTED  NONE DETECTED Final   Comment: (NOTE) DRUG SCREEN FOR MEDICAL PURPOSES ONLY.  IF CONFIRMATION IS NEEDED FOR ANY PURPOSE, NOTIFY LAB WITHIN 5 DAYS.  LOWEST DETECTABLE LIMITS FOR URINE DRUG SCREEN Drug Class                     Cutoff (ng/mL) Amphetamine and metabolites    1000 Barbiturate and metabolites    200 Benzodiazepine                 200 Opiates and metabolites        300 Cocaine and metabolites        300 THC                            50 Performed at [facility], [address] [place], [place] [address]    Acetaminophen  (Tylenol ), Serum 07/29/2023 <10 (L)  10 - 30 ug/mL Final   Comment: (NOTE) Therapeutic concentrations vary significantly. A range of 10-30 ug/mL  may be an effective concentration for many patients. However, some  are best treated at concentrations outside of this range. Acetaminophen  concentrations >150  ug/mL at 4 hours after ingestion  and >50 ug/mL at 12 hours after ingestion are often associated with  toxic reactions.  Performed at [facility], [address] [place], [place] [address]    Salicylate Lvl 07/29/2023 <7.0 (L)  7.0 - 30.0 mg/dL Final   Performed at [facility], [address], [place], [place] [address]    Blood Alcohol level:  Lab Results  Component Value Date   [facility] <15 07/29/2023    Metabolic Disorder Labs: No results found for: HGBA1C, MPG No results found for: PROLACTIN No results found for: CHOL, TRIG, HDL, CHOLHDL, VLDL, LDLCALC  Therapeutic Lab Levels: No results found for: LITHIUM No results found for: VALPROATE No results found for: CBMZ  Physical Findings   AUDIT    Flowsheet  Row [facility]  Alcohol Use Disorder Identification Test Final Score (AUDIT) 2   PHQ2-9    Flowsheet Row Video Visit from 08/18/2019 in Primary Care at [facility] Visit from 05/05/2018 in Primary Care at [facility] Visit from 06/26/2016 in Primary Care at [facility] Visit from 05/30/2016 in Primary Care at [facility] Visit from 05/03/2016 in Primary Care at [facility]  PHQ-2 Total Score 0 0 0 0 0   Flowsheet Row ED from 11/28/2023 in [facility] ED from 11/14/2023 in [facility] Admission (Discharged) [facility]  C-SSRS RISK CATEGORY No Risk No Risk No Risk     Musculoskeletal  Strength & Muscle Tone: within normal limits Gait & Station: normal Patient leans: N/A  Psychiatric Specialty Exam  Presentation  General Appearance:  Appropriate for Environment; Casual  Eye Contact: Good  Speech: Clear and Coherent; Normal Rate  Speech Volume: Normal  Handedness: Right   Mood and Affect   Mood: Anxious  Affect: Congruent   Thought Process  Thought Processes: Coherent; Linear  Descriptions of Associations:Intact  Orientation:Full (Time, Place and Person)  Thought Content:Logical  Diagnosis of Schizophrenia or Schizoaffective disorder in past: No  Duration of Psychotic Symptoms: Less than six months   Hallucinations:Hallucinations: None  Ideas of Reference:None  Suicidal Thoughts:Suicidal Thoughts: No  Homicidal Thoughts:Homicidal Thoughts: No   Sensorium  Memory: Immediate Fair  Judgment: Intact  Insight: Shallow   Executive Functions  Concentration: Good  Attention Span: Good  Recall: Fair  Fund of Knowledge: Good  Language: Good   Psychomotor Activity  Psychomotor Activity: Psychomotor Activity: Normal   Assets  Assets: Communication Skills; Desire for Improvement; Housing; Resilience   Sleep  Sleep: Sleep: Good  No Safety Checks orders active in given range  Nutritional Assessment (For OBS and FBC admissions only) Has the patient had a weight loss or gain of 10 pounds or more in the last 3 months?: No Has the patient had a decrease in food intake/or appetite?: No Does the patient have dental problems?: No Does the patient have eating habits or behaviors that may be indicators of an eating disorder including binging or inducing vomiting?: No Has the patient recently lost weight without trying?: 0 Has the patient been eating poorly because of a decreased appetite?: 0 Malnutrition Screening Tool Score: 0    Physical Exam  Physical Exam Pulmonary:     Effort: Pulmonary effort is normal.  Musculoskeletal:        General: Normal range of motion.  Neurological:     Mental Status: He is oriented to person, place, and time.    Review of Systems  Constitutional:  Negative for chills and fever.  Respiratory:  Negative for cough.   Cardiovascular:  Negative for chest pain and palpitations.  Gastrointestinal:   Negative for nausea and vomiting.  Psychiatric/Behavioral:  Positive for substance abuse. Negative for depression, hallucinations and suicidal ideas. The patient is nervous/anxious.    Blood pressure 111/60, pulse 83, temperature 98 F (36.7 C), temperature source Oral, resp. rate 18, SpO2 99%. There is no height or weight on file to calculate BMI.  Treatment Plan Summary:  Medical Decision Making  -First examination completed, involuntary commitment upheld -Attempts to draw blood unsuccessful, patient states that he has blown veins related to IV drug abuse.  We are rehydrating, and we will reattempt to draw blood for lab  work prior to transfer to the inpatient unit at the [facility] who have accepted patient for treatment and stabilization vs consideration of admission to facility based crisis unit if unsuccessful  -Continue Abilify 5 mg nightly for psychosis since patient reports that past trials of Risperdal  rendered him feeling too sedated during his last inpatient stay, thereby rendering compliance after discharge difficult. -Wellbutrin 150 mg daily in the mornings for depressive symptoms-medication choice made due to patient's reports of inattentiveness at baseline, as well as his preference of drugs which seems to be stimulants (methamphetamines)-seeking to foster compliance with this medication after discharge.-Patient denies a history of seizures, denies any history of head trauma. -Ordered baseline labs: TSH, lipid panel, hemoglobin A1c, CMP, CBC-Notes she would reattempt to draw as multiple draws unsuccessful  -Agitation protocol: Haldol /Ativan /Benadryl  as needed 3 times daily for agitation -Trazodone  50 mg nightly as needed for sleep -Hydroxyzine  25 mg 3 times daily as needed for anxiety - Nicotine  Patch 21 mcg daily  - SA-IOP Referral placed, appointment tentatively considered November 3rd 1 PM at [facility], email sent to coordinator about scheduling   Recommendations  Based on my evaluation the patient appears to have an emergency mental health condition for which I recommend the patient be transferred to an inpatient behavioral health unit for treatment and stabilization.     PATTI OLDEN, MD 11/29/2023 2:50 PM

## 2023-11-29 NOTE — ED Notes (Signed)
 Patient is sitting in bed quietly, patient appears to be in no acute distress, patient denies S/I, H/I, and AVH at this time. Will continue to monitor patient for safety.

## 2023-11-29 NOTE — ED Notes (Signed)
 Patient is lying in bed sleeping, patient appears to be in no acute distress, respirations are even and unlabored, will continue to monitor patient for safety.

## 2023-11-29 NOTE — Progress Notes (Signed)
   11/29/23 1828  [facility] Triage Screening (Walk-ins at [facility] only)  What Is the Reason for Your Visit/Call Today? Tiggs is a 38 year old male presenting to [facility] escorted by [employer] under IVC. Pt reports he was here yesterday and was sent to hospital to get blood work and return back for a bed.  Physical Abuse Denies  Verbal Abuse Denies  Sexual Abuse Denies  Exploitation of patient/patient's resources Denies

## 2023-11-29 NOTE — ED Notes (Signed)
 Pt sleeping at present, no distress noted, respirations even & unlabored.  Monitoring for safety. ?

## 2023-11-29 NOTE — ED Notes (Signed)
 Pt observed/assessed in recliner sleeping. RR even and unlabored, appearing in no noted distress. Environmental check complete, will continue to monitor for safety

## 2023-11-29 NOTE — ED Provider Notes (Signed)
 Patient was admitted under IVC and sent to the ED for lab draw. He was received back to [facility] in no apparent distress. On assessment, patient is alert and oriented 4, calm, and cooperative. He denies suicidal ideation, homicidal ideation, auditory or visual hallucinations, and has no new complaints at this time.

## 2023-11-29 NOTE — Discharge Instructions (Signed)
 Transfer to ED for medical workup

## 2023-11-29 NOTE — ED Notes (Signed)
 [facility] has been called, report has been given, and non-emergency police transport has been contacted to transport patient to [facility] for continuity of care
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
## 2023-11-29 NOTE — ED Notes (Signed)
 Pt readmiited to unit. Food/ juice provided.

## 2023-11-30 ENCOUNTER — Other Ambulatory Visit: Payer: Self-pay

## 2023-11-30 ENCOUNTER — Encounter (HOSPITAL_COMMUNITY): Payer: Self-pay | Admitting: Nurse Practitioner

## 2023-11-30 DIAGNOSIS — F1121 Opioid dependence, in remission: Secondary | ICD-10-CM | POA: Diagnosis present

## 2023-11-30 DIAGNOSIS — F1510 Other stimulant abuse, uncomplicated: Principal | ICD-10-CM | POA: Diagnosis present

## 2023-11-30 MED ORDER — HYDROXYZINE HCL 25 MG PO TABS
25.0000 mg | ORAL_TABLET | Freq: Three times a day (TID) | ORAL | Status: DC | PRN
  Administered 2023-11-30 (×2): 25 mg via ORAL
  Filled 2023-11-30 (×3): qty 1

## 2023-11-30 MED ORDER — ALUM & MAG HYDROXIDE-SIMETH 200-200-20 MG/5ML PO SUSP: 30.0000 mL | ORAL | Status: DC | PRN

## 2023-11-30 MED ORDER — TRAZODONE HCL 50 MG PO TABS
50.0000 mg | ORAL_TABLET | Freq: Every evening | ORAL | Status: DC | PRN
  Administered 2023-11-30 – 2023-12-01 (×2): 50 mg via ORAL
  Filled 2023-11-30 (×3): qty 1

## 2023-11-30 MED ORDER — NICOTINE POLACRILEX 2 MG MT GUM
2.0000 mg | CHEWING_GUM | OROMUCOSAL | Status: DC | PRN
  Administered 2023-12-01 – 2023-12-03 (×5): 2 mg via ORAL
  Filled 2023-11-30: qty 1

## 2023-11-30 MED ORDER — DIPHENHYDRAMINE HCL 50 MG/ML IJ SOLN: 50.0000 mg | Freq: Three times a day (TID) | INTRAMUSCULAR | Status: DC | PRN

## 2023-11-30 MED ORDER — MAGNESIUM HYDROXIDE 400 MG/5ML PO SUSP: 30.0000 mL | Freq: Every day | ORAL | Status: DC | PRN

## 2023-11-30 MED ORDER — DIPHENHYDRAMINE HCL 25 MG PO CAPS: 50.0000 mg | ORAL_CAPSULE | Freq: Three times a day (TID) | ORAL | Status: DC | PRN

## 2023-11-30 MED ORDER — HALOPERIDOL LACTATE 5 MG/ML IJ SOLN: 10.0000 mg | Freq: Three times a day (TID) | INTRAMUSCULAR | Status: DC | PRN

## 2023-11-30 MED ORDER — WHITE PETROLATUM EX OINT
TOPICAL_OINTMENT | CUTANEOUS | Status: AC
  Filled 2023-11-30: qty 5

## 2023-11-30 MED ORDER — ACETAMINOPHEN 325 MG PO TABS
650.0000 mg | ORAL_TABLET | Freq: Four times a day (QID) | ORAL | Status: DC | PRN
  Administered 2023-11-30: 650 mg via ORAL
  Filled 2023-11-30: qty 2

## 2023-11-30 MED ORDER — NICOTINE 14 MG/24HR TD PT24
14.0000 mg | MEDICATED_PATCH | Freq: Every day | TRANSDERMAL | Status: DC
  Administered 2023-11-30 – 2023-12-02 (×2): 14 mg via TRANSDERMAL
  Filled 2023-11-30 (×4): qty 1

## 2023-11-30 MED ORDER — LORAZEPAM 2 MG/ML IJ SOLN: 2.0000 mg | Freq: Three times a day (TID) | INTRAMUSCULAR | Status: DC | PRN

## 2023-11-30 MED ORDER — ARIPIPRAZOLE 2 MG PO TABS
2.0000 mg | ORAL_TABLET | Freq: Every day | ORAL | Status: DC
  Administered 2023-11-30 – 2023-12-01 (×2): 2 mg via ORAL
  Filled 2023-11-30 (×2): qty 1

## 2023-11-30 MED ORDER — HALOPERIDOL 5 MG PO TABS: 5.0000 mg | ORAL_TABLET | Freq: Three times a day (TID) | ORAL | Status: DC | PRN

## 2023-11-30 MED ORDER — HALOPERIDOL LACTATE 5 MG/ML IJ SOLN: 5.0000 mg | Freq: Three times a day (TID) | INTRAMUSCULAR | Status: DC | PRN

## 2023-11-30 NOTE — Group Note (Signed)
 Date:  11/30/2023 Time:  8:42 AM  Group Topic/Focus:  Goals Group:   The focus of this group is to help patients establish daily goals to achieve during treatment and discuss how the patient can incorporate goal setting into their daily lives to aide in recovery.    Participation Level:  Did Not Attend  Participation Quality:    Affect:    Cognitive:    Insight:   Engagement in Group:    Modes of Intervention:   Additional Comments:    Eleanor JAYSON Metro 11/30/2023, 8:42 AM
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
## 2023-11-30 NOTE — Tx Team (Signed)
 Initial Treatment Plan  Jermaine Hood FMW:[id number]    PATIENT STRESSORS: Financial difficulties   Marital or family conflict   Medication change or noncompliance   Substance abuse     PATIENT STRENGTHS: Ability for insight  Active sense of humor  Average or above average intelligence  Communication skills  General fund of knowledge    PATIENT IDENTIFIED PROBLEMS: Substance Abuse   Psychosis  SI   I want to work on going home.               DISCHARGE CRITERIA:  Improved stabilization in mood, thinking, and/or behavior Motivation to continue treatment in a less acute level of care Need for constant or close observation no longer present Reduction of life-threatening or endangering symptoms to within safe limits Verbal commitment to aftercare and medication compliance  PRELIMINARY DISCHARGE PLAN: Attend 12-step recovery group Outpatient therapy Return to previous living arrangement  PATIENT/FAMILY INVOLVEMENT: This treatment plan has been presented to and reviewed with the patient, Jermaine Hood. The patient has been given the opportunity to ask questions and make suggestions.  Charolet Maxcy, RN
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
## 2023-11-30 NOTE — Plan of Care (Signed)
   Problem: Safety: Goal: Periods of time without injury will increase Outcome: Progressing

## 2023-11-30 NOTE — Group Note (Signed)
 Date:  11/30/2023 Time:  5:35 PM  Group Topic/Focus:  Healthy Communication:   The focus of this group is to discuss communication, barriers to communication, as well as healthy ways to communicate with others. We went over the 5 love languages in the lesson book provided.    Participation Level:  Did Not Attend  Jermaine Hood 11/30/2023, 5:35 PM

## 2023-11-30 NOTE — Group Note (Deleted)
 LCSW Group Therapy Note  Group Date: 11/30/2023 Start Time: 1000 End Time: 1100   Type of Therapy and Topic:  Group Therapy - Healthy vs Unhealthy Coping Skills  Participation Level:  {[facility] PARTICIPATION [id number]}   Description of Group The focus of this group was to determine what unhealthy coping techniques typically are used by group members and what healthy coping techniques would be helpful in coping with various problems. Patients were guided in becoming aware of the differences between healthy and unhealthy coping techniques. Patients were asked to identify 2-3 healthy coping skills they would like to learn to use more effectively.  Therapeutic Goals 1. Patients learned that coping is what human beings do all day long to deal with various situations in their lives 2. Patients defined and discussed healthy vs unhealthy coping techniques 3. Patients identified their preferred coping techniques and identified whether these were healthy or unhealthy 4. Patients determined 2-3 healthy coping skills they would like to become more familiar with and use more often. 5. Patients provided support and ideas to each other   Summary of Patient Progress:  During group, *** expressed ***. Patient proved open to input from peers and feedback from CSW. Patient demonstrated *** insight into the subject matter, was respectful of peers, and participated throughout the entire session.   Therapeutic Modalities Cognitive Behavioral Therapy Motivational Interviewing  Camelia Olden, LCSWA 11/30/2023  2:49 PM

## 2023-11-30 NOTE — [facility] Group Notes (Signed)
 Patient attended social work group.

## 2023-11-30 NOTE — Progress Notes (Signed)
(  Sleep Hours) - 2.5 (Any PRNs that were needed, meds refused, or side effects to meds)- PRN Hydroxyzine  (Any disturbances and when (visitation, over night)- None (Concerns raised by the patient)- None (SI/HI/AVH)- Denies. Contracts for safety

## 2023-11-30 NOTE — [facility] Group Notes (Signed)
 [facility] Group Notes:  (Nursing/MHT/Case Management/Adjunct)  Date:  11/30/2023  Time:  9:28 PM  Type of Therapy:  Wrap-up group  Participation Level:  Active  Participation Quality:  Appropriate  Affect:  Appropriate  Cognitive:  Appropriate  Insight:  Appropriate  Engagement in Group:  Engaged  Modes of Intervention:  Education  Summary of Progress/Problems: Goal avoid negative thinking. Rated day 7/10.  Jermaine Hood 11/30/2023, 9:28 PM

## 2023-11-30 NOTE — Group Note (Signed)
 LCSW Group Therapy Note  Group Date: 11/30/2023 Start Time: 1000 End Time: 1045   Type of Therapy and Topic:  Group Therapy - Healthy vs Unhealthy Coping Skills  Participation Level:  Active   Description of Group The focus of this group was to determine what unhealthy coping techniques typically are used by group members and what self care techniques would be helpful in coping with various problems. Patients were guided in becoming aware of the differences between healthy and unhealthy coping techniques. Patients were asked to identify 2-3 self care skills they would like to learn to use more effectively.  Therapeutic Goals Patients learned that coping is what human beings do all day long to deal with various situations in their lives Patients defined and discussed healthy vs unhealthy coping techniques Patients identified their preferred coping techniques and identified whether these were healthy or unhealthy Patients determined 2-3 self care skills they would like to become more familiar with and use more often. Patients provided support and ideas to each other   Summary of Patient Progress:  During group, he expressed that he does self care. Patient proved open to input from peers and feedback from CSW. Patient demonstrated he goes on a hike and kayak and walk his dogs insight into the subject matter, was respectful of peers, and participated throughout the entire session.   Therapeutic Modalities Cognitive Behavioral Therapy Motivational Interviewing  Karema Tocci O Luka Stohr, LCSWA 11/30/2023  2:52 PM
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
## 2023-11-30 NOTE — Plan of Care (Signed)
  Problem: Activity: Goal: Sleeping patterns will improve Outcome: Progressing   Problem: Coping: Goal: Ability to verbalize frustrations and anger appropriately will improve Outcome: Progressing   Problem: Safety: Goal: Periods of time without injury will increase Outcome: Progressing

## 2023-11-30 NOTE — Progress Notes (Signed)
   11/30/23 1000  Psych Admission Type (Psych Patients Only)  Admission Status Involuntary  Psychosocial Assessment  Patient Complaints Depression;Substance abuse  Eye Contact Brief  Facial Expression Flat  Affect Appropriate to circumstance  Speech Logical/coherent  Interaction Guarded  Motor Activity Fidgety  Appearance/Hygiene Unremarkable  Behavior Characteristics Cooperative  Mood Depressed  Thought Process  Coherency Circumstantial  Content Blaming others  Delusions None reported or observed  Perception WDL  Hallucination None reported or observed  Judgment Poor  Confusion None  Danger to Self  Current suicidal ideation? Denies  Agreement Not to Harm Self Yes  Description of Agreement verbal  Danger to Others  Danger to Others None reported or observed

## 2023-11-30 NOTE — [facility] Suicide Risk Assessment (Signed)
 Suicide Risk Assessment  Admission Assessment    [facility] Admission Suicide Risk Assessment   Nursing information obtained from:  Patient Demographic factors:  Male, Caucasian, Unemployed Current Mental Status:  NA Loss Factors:  Financial problems / change in socioeconomic status, Loss of significant relationship Historical Factors:  Impulsivity Risk Reduction Factors:  NA  Total Time spent with patient: 45 minutes Principal Problem: Methamphetamine abuse (HCC) Diagnosis:  Principal Problem:   Methamphetamine abuse (HCC)   Subjective Data:   Patient began the interview vehemently denying any verbal, physical assaults or altercations with his girlfriend leading to this IVC.  He says that he had discovered a con artist that trouble keeps finding me.  Patient says that he has been trying to dig into her past, and he thinks this is the reason why she took out the IVC.   We asked patient if he thinks this new girlfriend is concerned for him and wants him to get help for his substance use, or if she has other motivations or secondary gain. He mentioned even if she does want him to stop using he doesn't currently feel his substance use is a huge issue for him. Patient says that he noticed his insurance card missing 1 day, and that the next day he had noticed it placed an unusual spot, and endorses the same thing happening with his birth certificate.  They are no longer together, and she will not be home when he returns.   Patient says he has significant substance use history, previously diagnosed with opioid use disorder. He is currently not on methadone , and has prescription for Suboxone that he uses as needed.  He says last time he used Suboxone was 2 to 4 weeks ago.  Longest period of sobriety from opioids has been 3.5 years, during which he denies symptoms of paranoia, mania, or psychosis.  Patient says he last used meth on Monday, and then had a long period of insomnia that led to this  confrontation with him and his girlfriend.  Patient's UDS also positive for benzodiazepines, the patient says he takes Klonopin as needed, about once a week, history of benzodiazepine withdrawal. Patient denies current suicidal ideations, homicidal ideation or auditory visual hallucinations.    Discussed with the patient starting a medication that could help with his mood lability while he is under IVC, and patient is agreeable to this, but does want to go home.  Continued Clinical Symptoms:  Alcohol Use Disorder Identification Test Final Score (AUDIT): 3 The Alcohol Use Disorders Identification Test, Guidelines for Use in Primary Care, Second Edition.  World Science Writer [facility]). Score between 0-7:  no or low risk or alcohol related problems. Score between 8-15:  moderate risk of alcohol related problems. Score between 16-19:  high risk of alcohol related problems. Score 20 or above:  warrants further diagnostic evaluation for alcohol dependence and treatment.   CLINICAL FACTORS:   Depression:   Comorbid alcohol abuse/dependence Alcohol/Substance Abuse/Dependencies More than one psychiatric diagnosis Previous Psychiatric Diagnoses and Treatments   Musculoskeletal: Strength & Muscle Tone: within normal limits Gait & Station: normal Patient leans: N/A  Psychiatric Specialty Exam:  Appearance: Appropriate for environment  Behavior: Cooperative and exhibiting some psychomotor agitation  Speech: Normal rate, rhythm, and volume; coherent   Mood: really good  Affect: Congruent, appropriate  Thought Process: Circumstantial  Thought Content: No delusions or paranoid ideation  SI/HI: Denies  Perceptions: Denies  Judgement: Fair  Insight: Lacking poor  Fund of Knowledge: WNL  Physical Exam: Constitutional:      General: He is not in acute distress.    Appearance: He is normal weight. He is not ill-appearing or toxic-appearing.  Pulmonary:     Effort: Pulmonary effort is  normal.  Neurological:     General: No focal deficit present.     Mental Status: He is alert.   COGNITIVE FEATURES THAT CONTRIBUTE TO RISK:  None    SUICIDE RISK:   Minimal: No identifiable suicidal ideation.  Patients presenting with no risk factors but with morbid ruminations; may be classified as minimal risk based on the severity of the depressive symptoms  PLAN OF CARE: See H&P for assessment and plan.   I certify that inpatient services furnished can reasonably be expected to improve the patient's condition.   Alfornia Light, DO 11/30/2023, 8:12 AM

## 2023-12-01 DIAGNOSIS — F1510 Other stimulant abuse, uncomplicated: Secondary | ICD-10-CM

## 2023-12-01 DIAGNOSIS — F1914 Other psychoactive substance abuse with psychoactive substance-induced mood disorder: Secondary | ICD-10-CM

## 2023-12-01 DIAGNOSIS — F329 Major depressive disorder, single episode, unspecified: Secondary | ICD-10-CM

## 2023-12-01 MED ORDER — ARIPIPRAZOLE 5 MG PO TABS
5.0000 mg | ORAL_TABLET | Freq: Every day | ORAL | Status: DC
  Administered 2023-12-02 – 2023-12-03 (×2): 5 mg via ORAL
  Filled 2023-12-01 (×2): qty 1

## 2023-12-01 MED ORDER — MELATONIN 3 MG PO TABS
3.0000 mg | ORAL_TABLET | Freq: Every day | ORAL | Status: DC
  Administered 2023-12-01: 3 mg via ORAL
  Filled 2023-12-01 (×2): qty 1

## 2023-12-01 NOTE — Group Note (Signed)
 Date:  12/01/2023 Time:  3:42 PM  Group Topic/Focus:  Bingo - Positive Socialization  The group engaged in a [employer] designed to encourage positive socialization, promote a supportive environment, and enhance cognitive skills. Patients participated actively, demonstrating good communication and cooperation with peers. The activity was structured to create a relaxed, enjoyable atmosphere, fostering a sense of community among participants.  Participation Level:  Did Not Attend  Participation Quality:  N/A  Affect:  N/A  Cognitive:  N/A  Insight: None  Engagement in Group:  None  Modes of Intervention:  N/A  Additional Comments:  Masin did not attend bingo.  Kristi HERO Denver Bentson 12/01/2023, 3:42 PM

## 2023-12-01 NOTE — [facility] Group Notes (Signed)
 [facility] Group Notes:  (Nursing/MHT/Case Management/Adjunct)  Date:  12/01/2023  Time: 8PM  Type of Therapy:  Wrap up group  Participation Level:  Active  Participation Quality:  Appropriate  Affect:  Appropriate  Cognitive:  Appropriate  Insight:  Appropriate  Engagement in Group:  Engaged  Modes of Intervention:  Discussion  Summary of Progress/Problems:Appropriate   Jermaine Hood 12/01/2023, 9:53 PM

## 2023-12-01 NOTE — Group Note (Signed)
 Date:  12/01/2023 Time:  11:42 AM  Group Topic/Focus:  Ted Talk & Group Reflection  This group watched a TED Talk on resilience, focusing on overcoming adversity and developing strength through challenges. The talk highlighted the importance of hope, perseverance, and learning from setbacks.  After the video, patient's shared what stood out to them and connected the concept of resilience to their own experiences.Patient's identified strengths and strategies they use to build resilience, such as seeking support and self-care. The group set goals for building resilience, encouraging small, actionable steps moving forward.  The session ended with a focus on self-compassion and practical ways to strengthen resilience. Participants reflected on past challenges they've overcome and were encouraged to take positive steps in the coming week.  Participation Level:  Active  Participation Quality:  Appropriate and Attentive  Affect:  Appropriate  Cognitive:  Alert and Appropriate  Insight: Appropriate and Improving  Engagement in Group:  Engaged and Improving  Modes of Intervention:  Discussion, Education, Exploration, and Socialization  Additional Comments:  [name] attended and actively participated in this wellness group.  Kristi HERO Floetta Brickey 12/01/2023, 11:42 AM

## 2023-12-01 NOTE — Group Note (Signed)
 Date:  12/01/2023 Time:  6:54 PM  Group Topic/Focus:  Goals Group:   The focus of this group is to help patients establish daily goals to achieve during treatment and discuss how the patient can incorporate goal setting into their daily lives to aide in recovery. Self Care:   The focus of this group is to help patients understand the importance of self-care in order to improve or restore emotional, physical, spiritual, interpersonal, and financial health. Spirituality:   The focus of this group is to discuss how one's spirituality can aide in recovery.    Participation Level:  Active  Participation Quality:  Appropriate  Affect:  Appropriate  Cognitive:  Appropriate  Insight: Appropriate  Engagement in Group:  Engaged  Modes of Intervention:  Discussion and Education  Additional Comments:    Juliene CHRISTELLA Huddle 12/01/2023, 6:54 PM

## 2023-12-01 NOTE — Progress Notes (Signed)
   12/01/23 0834  Psych Admission Type (Psych Patients Only)  Admission Status Involuntary  Psychosocial Assessment  Patient Complaints Depression;Anxiety  Eye Contact Fair  Facial Expression Flat  Affect Appropriate to circumstance  Speech Logical/coherent  Interaction Assertive  Motor Activity Fidgety  Appearance/Hygiene Unremarkable  Behavior Characteristics Appropriate to situation  Mood Anxious;Depressed  Thought Process  Coherency WDL  Content Blaming others  Delusions None reported or observed  Perception WDL  Hallucination None reported or observed  Judgment Poor  Confusion None  Danger to Self  Current suicidal ideation? Denies  Agreement Not to Harm Self Yes  Description of Agreement Verbal  Danger to Others  Danger to Others None reported or observed

## 2023-12-01 NOTE — Group Note (Signed)
 Date:  12/01/2023 Time:  10:19 AM  Group Topic/Focus:  Goals Group:   The focus of this group is to help patients establish daily goals to achieve during treatment and discuss how the patient can incorporate goal setting into their daily lives to aide in recovery.    Participation Level:  Active  Participation Quality:  Appropriate, Attentive, and Sharing  Affect:  Appropriate  Cognitive:  Alert and Appropriate  Insight: Appropriate and Improving  Engagement in Group:  Engaged and Improving  Modes of Intervention:  Discussion, Exploration, and Socialization  Additional Comments:  Jermaine Hood attended and actively participated in goals group.  Kristi [name] 12/01/2023, 10:19 AM

## 2023-12-01 NOTE — Group Note (Deleted)
 Date:  12/01/2023 Time:  10:42 AM  Group Topic/Focus:  Goals Group:   The focus of this group is to help patients establish daily goals to achieve during treatment and discuss how the patient can incorporate goal setting into their daily lives to aide in recovery.     Participation Level:  {B[facility] PARTICIPATION OZCZO:77735}  Participation Quality:  {B[facility] PARTICIPATION QUALITY:22265}  Affect:  {B[facility] AFFECT:22266}  Cognitive:  {[facility] COGNITIVE:22267}  Insight: {BHH Insight2:20797}  Engagement in Group:  {B[facility] ENGAGEMENT IN HMNLE:77731}  Modes of Intervention:  {BHH MODES OF INTERVENTION:22269}  Additional Comments:  ***  Jermaine Hood 12/01/2023, 10:42 AM

## 2023-12-01 NOTE — Progress Notes (Signed)
 [facility] Inpatient Psychiatry Progress Note  Date: 12/01/2023 Patient: Jermaine Hood MRN: [id number]  Jermaine Hood is a 38 y.o. M admitted for with a prior mental health history of polysubstance abuse including methamphetamines, opioids, & THC leading to Jermaine Hood under IVC filed by his girlfriend for SI.  Patient was recently discharged from [facility] in June after being IVCd by his father for complaints of worsening psychosis and paranoid ideations, he was diagnosed with MDD and polysubstance induced psychosis and started on Risperdal .   Assessment and Plan: Jermaine Hood is a 38 y.o. M admitted for with a prior mental health history of polysubstance abuse including methamphetamines, opioids, & THC leading to Jermaine Hood under IVC filed by his girlfriend for SI. His presentation is consistent with MDD vs substance induced mood disorder, cannot rule out some component of personality or bipolar disorder at this time.  Patient appears to be minimizing symptoms of depression on exam, with little insight to why he uses substances.  Will start him on low-dose Abilify for his water  agitation and mood lability.   # MDD vs. substance induced mood disorder  - Continue Abilify 2 mg daily for psychomotor agitation and mood stabilization - start Melatonin 3 mg at night for sleep   # Safety and Monitoring: - INVOLUNTARY  admission to inpatient psychiatric unit for safety, stabilization and treatment. - Daily contact with patient to assess and evaluate symptoms and progress in treatment - Patient's case to be discussed in multi-disciplinary team meeting -  Observation Level : q15 minute checks -  Vital signs:  q12 hours -  Precautions: suicide, elopement, and assault  Risk Assessment: Patient is minimal risk to himself or others at this time  Discharge Planning: Barriers to Discharge: Medication optimization Estimated Length of Stay: 2-4 days Predicted Discharge Location: Home  Interval  History: Chart reviewed. No significant events overnight. On assessment today, the patient reports no side effects with beginning Abilify.  Patient demonstrates some better insight today, saying he understands the role that he had to plan this.  He says I just like to get high, however does not have insight into how this to his behaviors are this hospitalization.  Patient says he did not sleep great last night, but attributes this to the new environment.  No new concerns at this time.  PRNs in the last 24 hours include trazodone , hydroxyzine , Tylenol   Collateral obtained from girlfriend, Rosaline [phone]):  Rosaline says patient has been struggling with addiction of heroin and meth for years.  She has been living with him since end of May, and was with him last time he was IVCd.  Rosaline says he would say horrific things to her, and then he wouldn't remember saying anything. He was being verbally abusive, he was seeing and hearing things.  Kate says she has been trying to get him to go to therapy.  Rosaline says patient made statement that he would be be better driving his truck off the cliff, and if she needs to stop lying to him. He says horrible things, but does not feel comfortable telling me, because she feels she would be betraying him.   This patient has been paranoid, accusing her of cheating on him while she's [employer], that she is an land cop, and a con-artist. Rosaline says one time patient said if I don't start telling him the truth, who she is and tell him who she's working for, that he would have to hurt her.  She  is pretty sure he was under the influence of a substance at the time, is unsure because she had just arrived at home.  Rosaline and patient's mother are trying to get him to go to [facility], he is resistant.    Physical Examination:  Vitals and nursing note reviewed  Musculoskeletal: Normal gait and station Strength & Muscle Tone: within normal limits Gait &  Station: normal  Mental Status Exam:  Appearance: Appears older than stated age, somewhat disheveled  Behavior: Some psychomotor agitation in the form of fidgeting  Attitude: Calm and cooperative  Speech: Normal rate and volume  Mood:  Positive and optimistic  Affect: Congruent  Thought Process: Linear, goal directed  Thought Content: WNL  SI/HI: Denies  Perceptions: Denies AVH, does not appear to be responding to internal stimuli  Judgement: Poor  Insight: Shallow  Fund of Knowledge: WNL   Lab Results:  No visits with results within 1 Day(s) from this visit.  Latest known visit with results is:  Admission on 11/28/2023, Discharged on 11/29/2023  Component Date Value Ref Range Status   WBC 11/29/2023 6.2  4.0 - 10.5 K/uL Final   RBC 11/29/2023 5.35  4.22 - 5.81 MIL/uL Final   Hemoglobin 11/29/2023 15.1  13.0 - 17.0 g/dL Final   HCT 89/75/7974 46.0  39.0 - 52.0 % Final   MCV 11/29/2023 86.0  80.0 - 100.0 fL Final   MCH 11/29/2023 28.2  26.0 - 34.0 pg Final   MCHC 11/29/2023 32.8  30.0 - 36.0 g/dL Final   RDW 89/75/7974 13.5  11.5 - 15.5 % Final   Platelets 11/29/2023 201  150 - 400 K/uL Final   nRBC 11/29/2023 0.0  0.0 - 0.2 % Final   Neutrophils Relative % 11/29/2023 63  % Final   Neutro Abs 11/29/2023 3.9  1.7 - 7.7 K/uL Final   Lymphocytes Relative 11/29/2023 29  % Final   Lymphs Abs 11/29/2023 1.8  0.7 - 4.0 K/uL Final   Monocytes Relative 11/29/2023 6  % Final   Monocytes Absolute 11/29/2023 0.4  0.1 - 1.0 K/uL Final   Eosinophils Relative 11/29/2023 2  % Final   Eosinophils Absolute 11/29/2023 0.1  0.0 - 0.5 K/uL Final   Basophils Relative 11/29/2023 0  % Final   Basophils Absolute 11/29/2023 0.0  0.0 - 0.1 K/uL Final   Immature Granulocytes 11/29/2023 0  % Final   Abs Immature Granulocytes 11/29/2023 0.01  0.00 - 0.07 K/uL Final   Sodium 11/29/2023 137  135 - 145 mmol/L Final   Potassium 11/29/2023 4.0  3.5 - 5.1 mmol/L Final   Chloride 11/29/2023 100  98 - 111  mmol/L Final   CO2 11/29/2023 22  22 - 32 mmol/L Final   Glucose, Bld 11/29/2023 107 (H)  70 - 99 mg/dL Final   BUN 89/75/7974 13  6 - 20 mg/dL Final   Creatinine, Ser 11/29/2023 0.91  0.61 - 1.24 mg/dL Final   Calcium 89/75/7974 9.9  8.9 - 10.3 mg/dL Final   Total Protein 89/75/7974 7.3  6.5 - 8.1 g/dL Final   Albumin 89/75/7974 3.9  3.5 - 5.0 g/dL Final   AST 89/75/7974 20  15 - 41 U/L Final   ALT 11/29/2023 10  0 - 44 U/L Final   Alkaline Phosphatase 11/29/2023 55  38 - 126 U/L Final   Total Bilirubin 11/29/2023 <0.2  0.0 - 1.2 mg/dL Final   GFR, Estimated 11/29/2023 >60  >60 mL/min Final   Anion gap 11/29/2023 15  5 - 15 Final   Hgb A1c MFr Bld 11/29/2023 4.9  4.8 - 5.6 % Final   Mean Plasma Glucose 11/29/2023 93.93  mg/dL Final   Magnesium  11/29/2023 2.1  1.7 - 2.4 mg/dL Final   Alcohol, Ethyl (B) 11/29/2023 <15  <15 mg/dL Final   Cholesterol 89/75/7974 147  0 - 200 mg/dL Final   Triglycerides 89/75/7974 64  <150 mg/dL Final   HDL 89/75/7974 50  >40 mg/dL Final   Total CHOL/HDL Ratio 11/29/2023 2.9  RATIO Final   VLDL 11/29/2023 13  0 - 40 mg/dL Final   LDL Cholesterol 11/29/2023 84  0 - 99 mg/dL Final   Color, Urine 89/75/7974 YELLOW  YELLOW Final   APPearance 11/29/2023 HAZY (A)  CLEAR Final   Specific Gravity, Urine 11/29/2023 1.017  1.005 - 1.030 Final   pH 11/29/2023 6.0  5.0 - 8.0 Final   Glucose, UA 11/29/2023 NEGATIVE  NEGATIVE mg/dL Final   Hgb urine dipstick 11/29/2023 NEGATIVE  NEGATIVE Final   Bilirubin Urine 11/29/2023 NEGATIVE  NEGATIVE Final   Ketones, ur 11/29/2023 5 (A)  NEGATIVE mg/dL Final   Protein, ur 89/75/7974 NEGATIVE  NEGATIVE mg/dL Final   Nitrite 89/75/7974 NEGATIVE  NEGATIVE Final   Leukocytes,Ua 11/29/2023 TRACE (A)  NEGATIVE Final   RBC / HPF 11/29/2023 0-5  0 - 5 RBC/hpf Final   WBC, UA 11/29/2023 0-5  0 - 5 WBC/hpf Final   Bacteria, UA 11/29/2023 NONE SEEN  NONE SEEN Final   Squamous Epithelial / HPF 11/29/2023 0-5  0 - 5 /HPF Final   Mucus  11/29/2023 PRESENT   Final   POC Amphetamine UR 11/29/2023 Positive (A)  NONE DETECTED (Cut Off Level 1000 ng/mL) Final   POC Secobarbital (BAR) 11/29/2023 None Detected  NONE DETECTED (Cut Off Level 300 ng/mL) Final   POC Buprenorphine (BUP) 11/29/2023 None Detected  NONE DETECTED (Cut Off Level 10 ng/mL) Final   POC Oxazepam (BZO) 11/29/2023 Positive (A)  NONE DETECTED (Cut Off Level 300 ng/mL) Final   POC Cocaine UR 11/29/2023 None Detected  NONE DETECTED (Cut Off Level 300 ng/mL) Final   POC Methamphetamine UR 11/29/2023 Positive (A)  NONE DETECTED (Cut Off Level 1000 ng/mL) Final   POC Morphine 11/29/2023 None Detected  NONE DETECTED (Cut Off Level 300 ng/mL) Final   POC Methadone  UR 11/29/2023 None Detected  NONE DETECTED (Cut Off Level 300 ng/mL) Final   POC Oxycodone  UR 11/29/2023 None Detected  NONE DETECTED (Cut Off Level 100 ng/mL) Final   POC Marijuana UR 11/29/2023 Positive (A)  NONE DETECTED (Cut Off Level 50 ng/mL) Final   TSH 11/29/2023 0.163 (L)  0.350 - 4.500 uIU/mL Final     Vitals: Blood pressure 109/63, pulse 70, temperature 98.3 F (36.8 C), temperature source Oral, resp. rate 16, height 5' 8 (1.727 m), weight 67 kg, SpO2 99%.   NB: This note was created using a voice recognition software as a result there may be grammatical errors inadvertently enclosed that do not reflect the nature of this encounter. Every attempt is made to correct such errors.   Alfornia Light, DO PGY-1, Psychiatry Residency  12/01/2023, 10:12 AM

## 2023-12-01 NOTE — Plan of Care (Signed)

## 2023-12-01 NOTE — Progress Notes (Signed)
 Shift Note  (Sleep Hours) - 7.5  (Any PRNs that were needed, meds refused, or side effects to meds)- PO PRN Hydroxyzine  and Trazodone    (Any disturbances and when (visitation, over night)- Denies  (Concerns raised by the patient)- Pt stated trazodone  does not work for him and prefer Seroquel   (SI/HI/AVH)- Denies

## 2023-12-01 NOTE — Plan of Care (Signed)
(  Sleep Hours) -7 (Any PRNs that were needed, meds refused, or side effects to meds)- Trazodone  50 mg given for sleep.  Medication was effective (Any disturbances and when (visitation, over night)- none reported (Concerns raised by the patient)- no concerns raised (SI/HI/AVH)- Denies all  Problem: Education: Goal: Knowledge of [place] [facility] Education information/materials will improve Outcome: Progressing Goal: Emotional status will improve Outcome: Progressing Goal: Verbalization of understanding the information provided will improve Outcome: Progressing   Problem: Activity: Goal: Interest or engagement in activities will improve Outcome: Progressing   Problem: Coping: Goal: Ability to verbalize frustrations and anger appropriately will improve Outcome: Progressing

## 2023-12-02 ENCOUNTER — Encounter (HOSPITAL_COMMUNITY): Payer: Self-pay

## 2023-12-02 NOTE — Group Note (Signed)
 Date:  12/02/2023 Time:  12:14 PM  Group Topic/Focus:  Physical Wellness: The group likely aims to help participants improve their cardiovascular health, flexibility, strength, and endurance. Encouraging regular exercise tailored to different fitness levels and goals. Emotional Education:   The focus of this group is to discuss what feelings/emotions are, and how they are experienced. Pt's wrote a letter to their future self. This activity focused on: Self-Reflection: The group may want to reflect on their current thoughts, hopes, dreams, and challenges. Writing to their future selves allows them to pause and consider where they are in life and what they want to achieve. Goal Setting: A letter to the future can be a way to articulate personal or group goals. It helps individuals visualize where they hope to be and what they want to have accomplished by a certain time. Motivation: Revisiting a letter written to their future selves can serve as a reminder of the reasons behind their goals, and it can inspire them to stay on track or push through difficult times. Growth Tracking: A letter serves as a time capsule, allowing the group to measure how far they've come in terms of personal or collective growth. It can be a way to compare past expectations with present realities. Connection and Unity: If the group is doing this activity together, it can foster a sense of connection and shared purpose. Writing to the future encourages a sense of solidarity as everyone collectively reflects on their future paths. Coping with Change: For some, the letter may be a tool for dealing with uncertainty or major life transitions. It's a way to anchor themselves in their current state of mind and emotions, so they can see how they've adapted over time.    Participation Level:  Active  Participation Quality:  Appropriate  Affect:  Appropriate  Cognitive:  Appropriate  Insight: Appropriate  Engagement in Group:   Engaged  Modes of Intervention:  Activity and Discussion  Additional Comments:  Pt engaged in emotional wellness portion of group. Pt left before physical wellness group began.   Drianna Chandran R Hasini Peachey 12/02/2023, 12:14 PM

## 2023-12-02 NOTE — Plan of Care (Signed)
   Problem: Activity: Goal: Interest or engagement in activities will improve Outcome: Progressing   Problem: Coping: Goal: Ability to verbalize frustrations and anger appropriately will improve Outcome: Progressing   Problem: Safety: Goal: Periods of time without injury will increase Outcome: Progressing

## 2023-12-02 NOTE — Group Note (Signed)
 Recreation Therapy Group Note   Group Topic:Leisure Education  Group Date: 12/02/2023 Start Time: 0936 End Time: 1008 Facilitators: Kevonte Vanecek-McCall, LRT,CTRS Location: [address] Dayroom   Group Topic: Leisure Education  Goal Area(s) Addresses:  Patient will successfully identify positive leisure and recreation activities.  Patient will acknowledge benefits of participation in healthy leisure activities post discharge.  Patient will actively work with peers toward a shared goal.   Behavioral Response: Engaged   Intervention: Competitive Group Game    Activity: Keep It Contractor. Patients were spread out throughout the room and were to remain seated at all times. Patients were to hit the beach ball to each other as if playing volleyball. Patients were to keep the ball going for as long as they could. Patients would be timed throughout the activity and if the ball came to a complete stop, the time would start over.    Education:  Teacher, English As A Foreign Language, Leisure as [employer] Officer, Programmer, Applications, Building Control Surveyor   Education Outcome: Acknowledges education/In group clarification offered/Needs additional education   Affect/Mood: Appropriate   Participation Level: Engaged   Participation Quality: Independent   Behavior: Appropriate   Speech/Thought Process: Focused   Insight: Good   Judgement: Good   Modes of Intervention: Cooperative Play   Patient Response to Interventions:  Engaged   Education Outcome:  In group clarification offered    Clinical Observations/Individualized Feedback: Pt was bright during activity. Pt would also make jokes with peers and was very active in group. Pt was appropriate and participated complete group.      Plan: Continue to engage patient in RT group sessions 2-3x/week.   Dealie Koelzer-McCall, LRT,CTRS 12/02/2023 11:17 AM

## 2023-12-02 NOTE — BH IP Treatment Plan (Signed)
 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[name])  Secondary Diagnoses: Principal Problem:   Stimulant-induced psychotic disorder (HCC) Active Problems:   Methamphetamine abuse (HCC)   Opioid use disorder, severe, in early remission (HCC)   Current Medications:  Current Facility-Administered Medications  Medication Dose Route Frequency Provider Last Rate Last Admin   acetaminophen  (TYLENOL ) tablet 650 mg  650 mg Oral Q6H PRN Ajibola, Ene A, NP   650 mg at 11/30/23 1501   alum & mag hydroxide-simeth (MAALOX/MYLANTA) 200-200-20 MG/5ML suspension 30 mL  30 mL Oral Q4H PRN Ajibola, Ene A, NP       ARIPiprazole (ABILIFY) tablet 5 mg  5 mg Oral Daily Chandra Charleston Christian Lee, DO   5 mg at 12/02/23 9180   haloperidol  (HALDOL ) tablet 5 mg  5 mg Oral TID PRN Ajibola, Ene A, NP       And   diphenhydrAMINE  (BENADRYL ) capsule 50 mg  50 mg Oral TID PRN Ajibola, Ene A, NP       haloperidol  lactate (HALDOL ) injection 5 mg  5 mg Intramuscular TID PRN Ajibola, Ene A, NP       And   diphenhydrAMINE  (BENADRYL ) injection 50 mg  50 mg Intramuscular TID PRN Ajibola, Ene A, NP       And   LORazepam  (ATIVAN ) injection 2 mg  2 mg Intramuscular TID PRN Ajibola, Ene A, NP       haloperidol  lactate (HALDOL ) injection 10 mg  10 mg Intramuscular TID PRN Ajibola, Ene A, NP       And   diphenhydrAMINE  (BENADRYL ) injection 50 mg  50 mg Intramuscular TID PRN Ajibola, Ene A, NP       And   LORazepam  (ATIVAN ) injection 2 mg  2 mg Intramuscular TID PRN Ajibola, Ene A, NP       hydrOXYzine  (ATARAX ) tablet 25 mg  25 mg Oral TID PRN Ajibola, Ene A, NP   25 mg at 11/30/23 2121   magnesium  hydroxide (MILK OF MAGNESIA) suspension 30 mL  30 mL Oral Daily PRN Ajibola, Ene A, NP       melatonin tablet 3 mg  3 mg Oral QHS Faunce, Alina, DO   3 mg at 12/01/23 2101   nicotine  (NICODERM CQ  -  dosed in mg/24 hours) patch 14 mg  14 mg Transdermal Daily Pashayan, Alexander S, DO   14 mg at 12/02/23 0818   nicotine  polacrilex (NICORETTE) gum 2 mg  2 mg Oral PRN Bobbitt, Shalon E, NP   2 mg at 12/02/23 1613   traZODone  (DESYREL ) tablet 50 mg  50 mg Oral QHS PRN Ajibola, Ene A, NP   50 mg at 12/01/23 2101   PTA Medications: Medications Prior to Admission  Medication Sig Dispense Refill Last Dose/Taking   buprenorphine-naloxone  (SUBOXONE) 8-2 mg SUBL SL tablet Place 0.5 tablets under the tongue 4 (four) times daily as needed (For opioid use disorder).      hydrOXYzine  (ATARAX ) 25 MG tablet Take 25 mg by mouth 3 (three) times daily as needed for anxiety.      naloxone  (NARCAN ) nasal spray 4 mg/0.1 mL Place 1 spray into the nose once as needed (For opioid overdose).      risperiDONE  (RISPERDAL ) 1 MG tablet Take 1 tablet (1 mg total) by mouth at bedtime. For mood control (Patient not taking: Reported on 11/29/2023) 30 tablet 0    tiZANidine (ZANAFLEX)  4 MG tablet Take 4 mg by mouth every 6 (six) hours as needed for muscle spasms.       Patient Stressors: Financial difficulties   Marital or family conflict   Medication change or noncompliance   Substance abuse    Patient Strengths: Ability for insight  Active sense of humor  Average or above average intelligence  Communication skills  General fund of knowledge   Treatment Modalities: Medication Management, Group therapy, Case management,  1 to 1 session with clinician, Psychoeducation, Recreational therapy.   Physician Treatment Plan for Primary Diagnosis: Stimulant-induced psychotic disorder (HCC) Long Term Goal(s): Improvement in symptoms so as ready for discharge   Short Term Goals: Ability to identify changes in lifestyle to reduce recurrence of condition will improve Ability to identify and develop effective coping behaviors will improve Ability to identify triggers associated with substance abuse/mental health issues will  improve  Medication Management: Evaluate patient's response, side effects, and tolerance of medication regimen.  Therapeutic Interventions: 1 to 1 sessions, Unit Group sessions and Medication administration.  Evaluation of Outcomes: Progressing  Physician Treatment Plan for Secondary Diagnosis: Principal Problem:   Stimulant-induced psychotic disorder (HCC) Active Problems:   Methamphetamine abuse (HCC)   Opioid use disorder, severe, in early remission (HCC)  Long Term Goal(s): Improvement in symptoms so as ready for discharge   Short Term Goals: Ability to identify changes in lifestyle to reduce recurrence of condition will improve Ability to identify and develop effective coping behaviors will improve Ability to identify triggers associated with substance abuse/mental health issues will improve     Medication Management: Evaluate patient's response, side effects, and tolerance of medication regimen.  Therapeutic Interventions: 1 to 1 sessions, Unit Group sessions and Medication administration.  Evaluation of Outcomes: Progressing   RN Treatment Plan for Primary Diagnosis: Stimulant-induced psychotic disorder (HCC) Long Term Goal(s): Knowledge of disease and therapeutic regimen to maintain health will improve  Short Term Goals: Ability to verbalize frustration and anger appropriately will improve, Ability to demonstrate self-control, Ability to disclose and discuss suicidal ideas, and Ability to identify and develop effective coping behaviors will improve  Medication Management: RN will administer medications as ordered by provider, will assess and evaluate patient's response and provide education to patient for prescribed medication. RN will report any adverse and/or side effects to prescribing provider.  Therapeutic Interventions: 1 on 1 counseling sessions, Psychoeducation, Medication administration, Evaluate responses to treatment, Monitor vital signs and CBGs as ordered,  Perform/monitor CIWA, COWS, AIMS and Fall Risk screenings as ordered, Perform wound care treatments as ordered.  Evaluation of Outcomes: Progressing   LCSW Treatment Plan for Primary Diagnosis: Stimulant-induced psychotic disorder [facility]) Long Term Goal(s): Safe transition to appropriate next level of care at discharge, Engage patient in therapeutic group addressing interpersonal concerns.  Short Term Goals: Engage patient in aftercare planning with referrals and resources, Increase ability to appropriately verbalize feelings, Increase emotional regulation, Facilitate acceptance of mental health diagnosis and concerns, Facilitate patient progression through stages of change regarding substance use diagnoses and concerns, Identify triggers associated with mental health/substance abuse issues, and Increase skills for wellness and recovery  Therapeutic Interventions: Assess for all discharge needs, 1 to 1 time with Social worker, Explore available resources and support systems, Assess for adequacy in community support network, Educate family and significant other(s) on suicide prevention, Complete Psychosocial Assessment, Interpersonal group therapy.  Evaluation of Outcomes: Progressing   Progress in Treatment: Attending groups: Yes. Participating in groups: Yes. Taking medication as prescribed: Yes. Toleration  medication: Yes. Family/Significant other contact made: Yes Patient understands diagnosis: Yes. Discussing patient identified problems/goals with staff: Yes. Medical problems stabilized or resolved: Yes. Denies suicidal/homicidal ideation: Yes. Issues/concerns per patient self-inventory: No.   Patient Goals:  Get my roommate to move out, participate in outpatient therapy  Discharge Plan or Barriers: Discharge home with outpatient therapy referral  Reason for Continuation of Hospitalization: Medication stabilization Suicidal ideation  Estimated Length of Stay: 1-2 days  Last 3  [place] Suicide Severity Risk Score: Flowsheet Row [facility] ([facility] Most recent reading at 11/29/2023 11:40 PM ED from [facility]/29/2023 in [facility] Most recent reading at 11/29/2023  7:58 PM ED from 11/28/2023 in [facility] Most recent reading at 11/28/2023  6:28 PM  C-SSRS RISK CATEGORY No Risk No Risk No Risk    Last PHQ 2/9 Scores:    08/18/2019    4:57 PM 05/05/2018    1:36 PM 06/26/2016    9:15 AM  Depression screen PHQ 2/9  Decreased Interest 0 0   Down, Depressed, Hopeless 0 0 0  PHQ - 2 Score 0 0 0    Scribe for Treatment Team: Oliva DELENA Salmon, DO 12/02/2023 5:06 PM

## 2023-12-02 NOTE — Group Note (Signed)
 Date:  12/02/2023 Time:  3:29 PM  Group Topic/Focus: Grief and Loss (Chaplain)   Pt did not attend chaplain group   Jermaine Hood 12/02/2023, 3:29 PM

## 2023-12-02 NOTE — Progress Notes (Signed)
(  Sleep Hours) -6.25  (Any PRNs that were needed, meds refused, or side effects to meds)- none  (Any disturbances and when (visitation, over night)-none  (Concerns raised by the patient)- none  (SI/HI/AVH)- denies

## 2023-12-02 NOTE — Progress Notes (Signed)
   12/02/23 0800  Psych Admission Type (Psych Patients Only)  Admission Status Involuntary  Psychosocial Assessment  Patient Complaints Anxiety;Depression  Eye Contact Fair  Facial Expression Flat  Affect Appropriate to circumstance  Speech Logical/coherent  Interaction Assertive  Motor Activity Other (Comment) (WDL)  Appearance/Hygiene Unremarkable  Behavior Characteristics Appropriate to situation  Mood Anxious  Thought Process  Coherency WDL  Content WDL  Delusions None reported or observed  Perception WDL  Hallucination None reported or observed  Judgment WDL  Confusion None  Danger to Self  Current suicidal ideation? Denies  Agreement Not to Harm Self Yes  Description of Agreement Verbal

## 2023-12-02 NOTE — Group Note (Signed)
 Date:  12/02/2023 Time:  9:55 AM  Group Topic/Focus:  Goals Group:   The focus of this group is to help patients establish daily goals to achieve during treatment and discuss how the patient can incorporate goal setting into their daily lives to aide in recovery. Orientation:   The focus of this group is to educate the patient on the purpose and policies of crisis stabilization and provide a format to answer questions about their admission.  The group details unit policies and expectations of patients while admitted.    Participation Level:  Active  Participation Quality:  Appropriate  Affect:  Appropriate  Cognitive:  Appropriate  Insight: Appropriate  Engagement in Group:  Engaged  Modes of Intervention:  Discussion  Additional Comments:    Seville Downs R Kalkidan Caudell 12/02/2023, 9:55 AM

## 2023-12-02 NOTE — Group Note (Signed)
 Date:  12/02/2023 Time:  10:50 AM  Group Topic/Focus: Recreational Therapy   Pt did attend recreational therapy group  Jermaine Hood R Rabon Scholle 12/02/2023, 10:50 AM

## 2023-12-02 NOTE — Progress Notes (Signed)
 [facility] Inpatient Psychiatry Progress Note  Date: 12/02/23 Patient: Jermaine Hood MRN: [id number]  Assessment and Plan: Jermaine Hood is a 38 y.o. M with a prior mental health history of polysubstance abuse including methamphetamines, opioids, & THC leading to [facility] under IVC filed by his girlfriend for expressed suicidal ideation. This appears to have been in the context of acute intoxication with multiple substances.    10/27 - Patient no longer suicidal. No active psychosis. He is tolerating Abilify well. He expressed an interest in outpatient treatment after discharge. He no longer appears to meet criteria for a continued involuntary hospitalization.   # Methamphetamine abuse (HCC) # Stimulant induced psychotic disorder # Opioid use disorder, severe, in early remission - Abilify 5 mg daily - Poor insight, not interested in SUD treatment at this time.  # Suicidal Ideation - Resolved   Risk Assessment - Low for harm to self or others  Discharge Planning Barriers to discharge: Stabilization Estimated length of stay: 1-2 days Predicted Discharge location: Home     Interval History and update: No significant events overnight. Patient received prn trazodone  last night for insomnia. He has been attending groups and has been visible in the milieu. Behavior is overall appropriate. On assessment today he reports that he is feeling better and he denies suicidal ideation or any hallucinations. He states that he believes the underlying issue related to these two hospitalizations is his GF and not necessarily his drug use. He does state, however, that he has been abstinent from substances in the past and he believes that he can do this again in the future. He stated that he feels as if she is taking advantage of him and that he is being conned. He again stated that she is making up the claims of abuse. He states that he is interested in participating in an IOP program  for mental health, but not substance use. He is tolerating the Abilify, denies any significant side effects, and       Physical Exam MSK/Neuro - Normal gait and station  Mental Status Exam Appearance - Casually dressed, appropriate hygiene and grooming  Attitude - Calm, polite, not guarded Speech - normal volume, prosody, inflection Mood - Okay Affect - Restricted Thought Process - LLGD Thought Content - No delusional TC expressed SI/HI - Denies  Perceptions - Denies AVH; not RIS Judgement/Insight - Limited (expresses some understanding of substance use as it relates to his current problems) Fund of knowledge - WNL Language - No impairments      Lab Results:  No visits with results within 1 Day(s) from this visit.  Latest known visit with results is:  Admission on 11/28/2023, Discharged on 11/29/2023  Component Date Value Ref Range Status   WBC 11/29/2023 6.2  4.0 - 10.5 K/uL Final   RBC 11/29/2023 5.35  4.22 - 5.81 MIL/uL Final   Hemoglobin 11/29/2023 15.1  13.0 - 17.0 g/dL Final   HCT 89/75/7974 46.0  39.0 - 52.0 % Final   MCV 11/29/2023 86.0  80.0 - 100.0 fL Final   MCH 11/29/2023 28.2  26.0 - 34.0 pg Final   MCHC 11/29/2023 32.8  30.0 - 36.0 g/dL Final   RDW 89/75/7974 13.5  11.5 - 15.5 % Final   Platelets 11/29/2023 201  150 - 400 K/uL Final   nRBC 11/29/2023 0.0  0.0 - 0.2 % Final   Neutrophils Relative % 11/29/2023 63  % Final   Neutro Abs 11/29/2023 3.9  1.7 -  7.7 K/uL Final   Lymphocytes Relative 11/29/2023 29  % Final   Lymphs Abs 11/29/2023 1.8  0.7 - 4.0 K/uL Final   Monocytes Relative 11/29/2023 6  % Final   Monocytes Absolute 11/29/2023 0.4  0.1 - 1.0 K/uL Final   Eosinophils Relative 11/29/2023 2  % Final   Eosinophils Absolute 11/29/2023 0.1  0.0 - 0.5 K/uL Final   Basophils Relative 11/29/2023 0  % Final   Basophils Absolute 11/29/2023 0.0  0.0 - 0.1 K/uL Final   Immature Granulocytes 11/29/2023 0  % Final   Abs Immature Granulocytes 11/29/2023 0.01   0.00 - 0.07 K/uL Final   Sodium 11/29/2023 137  135 - 145 mmol/L Final   Potassium 11/29/2023 4.0  3.5 - 5.1 mmol/L Final   Chloride 11/29/2023 100  98 - 111 mmol/L Final   CO2 11/29/2023 22  22 - 32 mmol/L Final   Glucose, Bld 11/29/2023 107 (H)  70 - 99 mg/dL Final   BUN 89/75/7974 13  6 - 20 mg/dL Final   Creatinine, Ser 11/29/2023 0.91  0.61 - 1.24 mg/dL Final   Calcium 89/75/7974 9.9  8.9 - 10.3 mg/dL Final   Total Protein 89/75/7974 7.3  6.5 - 8.1 g/dL Final   Albumin 89/75/7974 3.9  3.5 - 5.0 g/dL Final   AST 89/75/7974 20  15 - 41 U/L Final   ALT 11/29/2023 10  0 - 44 U/L Final   Alkaline Phosphatase 11/29/2023 55  38 - 126 U/L Final   Total Bilirubin 11/29/2023 <0.2  0.0 - 1.2 mg/dL Final   GFR, Estimated 11/29/2023 >60  >60 mL/min Final   Anion gap 11/29/2023 15  5 - 15 Final   Hgb A1c MFr Bld 11/29/2023 4.9  4.8 - 5.6 % Final   Mean Plasma Glucose 11/29/2023 93.93  mg/dL Final   Magnesium  11/29/2023 2.1  1.7 - 2.4 mg/dL Final   Alcohol, Ethyl (B) 11/29/2023 <15  <15 mg/dL Final   Cholesterol 89/75/7974 147  0 - 200 mg/dL Final   Triglycerides 89/75/7974 64  <150 mg/dL Final   HDL 89/75/7974 50  >40 mg/dL Final   Total CHOL/HDL Ratio 11/29/2023 2.9  RATIO Final   VLDL 11/29/2023 13  0 - 40 mg/dL Final   LDL Cholesterol 11/29/2023 84  0 - 99 mg/dL Final   Color, Urine 89/75/7974 YELLOW  YELLOW Final   APPearance 11/29/2023 HAZY (A)  CLEAR Final   Specific Gravity, Urine 11/29/2023 1.017  1.005 - 1.030 Final   pH 11/29/2023 6.0  5.0 - 8.0 Final   Glucose, UA 11/29/2023 NEGATIVE  NEGATIVE mg/dL Final   Hgb urine dipstick 11/29/2023 NEGATIVE  NEGATIVE Final   Bilirubin Urine 11/29/2023 NEGATIVE  NEGATIVE Final   Ketones, ur 11/29/2023 5 (A)  NEGATIVE mg/dL Final   Protein, ur 89/75/7974 NEGATIVE  NEGATIVE mg/dL Final   Nitrite 89/75/7974 NEGATIVE  NEGATIVE Final   Leukocytes,Ua 11/29/2023 TRACE (A)  NEGATIVE Final   RBC / HPF 11/29/2023 0-5  0 - 5 RBC/hpf Final   WBC, UA  11/29/2023 0-5  0 - 5 WBC/hpf Final   Bacteria, UA 11/29/2023 NONE SEEN  NONE SEEN Final   Squamous Epithelial / HPF 11/29/2023 0-5  0 - 5 /HPF Final   Mucus 11/29/2023 PRESENT   Final   POC Amphetamine UR 11/29/2023 Positive (A)  NONE DETECTED (Cut Off Level 1000 ng/mL) Final   POC Secobarbital (BAR) 11/29/2023 None Detected  NONE DETECTED (Cut Off Level 300 ng/mL) Final  POC Buprenorphine (BUP) 11/29/2023 None Detected  NONE DETECTED (Cut Off Level 10 ng/mL) Final   POC Oxazepam (BZO) 11/29/2023 Positive (A)  NONE DETECTED (Cut Off Level 300 ng/mL) Final   POC Cocaine UR 11/29/2023 None Detected  NONE DETECTED (Cut Off Level 300 ng/mL) Final   POC Methamphetamine UR 11/29/2023 Positive (A)  NONE DETECTED (Cut Off Level 1000 ng/mL) Final   POC Morphine 11/29/2023 None Detected  NONE DETECTED (Cut Off Level 300 ng/mL) Final   POC Methadone  UR 11/29/2023 None Detected  NONE DETECTED (Cut Off Level 300 ng/mL) Final   POC Oxycodone  UR 11/29/2023 None Detected  NONE DETECTED (Cut Off Level 100 ng/mL) Final   POC Marijuana UR 11/29/2023 Positive (A)  NONE DETECTED (Cut Off Level 50 ng/mL) Final   TSH 11/29/2023 0.163 (L)  0.350 - 4.500 uIU/mL Final     Vitals: Blood pressure 122/81, pulse 89, temperature 97.6 F (36.4 C), temperature source Oral, resp. rate 16, height 5' 8 (1.727 m), weight 67 kg, SpO2 99%.    Oliva DELENA Salmon, DO

## 2023-12-02 NOTE — [facility] Suicide Risk Assessment (Signed)
 [facility] INPATIENT:  Family/Significant Other Suicide Prevention Education  Suicide Prevention Education:  Contact Attempts: Jermaine Hood (dad) [phone] 1st attempt 10/27 @1558  , (name of family member/significant other) has been identified by the patient as the family member/significant other with whom the patient will be residing, and identified as the person(s) who will aid the patient in the event of a mental health crisis.  With written consent from the patient, two attempts were made to provide suicide prevention education, prior to and/or following the patient's discharge.  We were unsuccessful in providing suicide prevention education.  A suicide education pamphlet was given to the patient to share with family/significant other.  Jermaine Hood (dad) [phone]  1st attempt 10/27 @1558    [name] 12/02/2023, 3:59 PM

## 2023-12-02 NOTE — [facility] Group Notes (Signed)
 Adult Psychoeducational Group Note  Date:  12/02/2023 Time:  9:26 PM  Group Topic/Focus:  Wrap-Up Group:   The focus of this group is to help patients review their daily goal of treatment and discuss progress on daily workbooks.  Participation Level:  Did Not Attend  Jermaine Hood 12/02/2023, 9:26 PM

## 2023-12-02 NOTE — Progress Notes (Signed)
 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[facility])  Secondary Diagnoses: Principal Problem:   Stimulant-induced psychotic disorder (HCC) Active Problems:   Methamphetamine abuse (HCC)   Opioid use disorder, severe, in early remission (HCC)   Current Medications:  Current Facility-Administered Medications  Medication Dose Route Frequency Provider Last Rate Last Admin   acetaminophen  (TYLENOL ) tablet 650 mg  650 mg Oral Q6H PRN Ajibola, Ene A, NP   650 mg at 11/30/23 1501   alum & mag hydroxide-simeth (MAALOX/MYLANTA) 200-200-20 MG/5ML suspension 30 mL  30 mL Oral Q4H PRN Ajibola, Ene A, NP       ARIPiprazole (ABILIFY) tablet 5 mg  5 mg Oral Daily Chandra Charleston Christian Lee, DO   5 mg at 12/02/23 9180   haloperidol  (HALDOL ) tablet 5 mg  5 mg Oral TID PRN Ajibola, Ene A, NP       And   diphenhydrAMINE  (BENADRYL ) capsule 50 mg  50 mg Oral TID PRN Ajibola, Ene A, NP       haloperidol  lactate (HALDOL ) injection 5 mg  5 mg Intramuscular TID PRN Ajibola, Ene A, NP       And   diphenhydrAMINE  (BENADRYL ) injection 50 mg  50 mg Intramuscular TID PRN Ajibola, Ene A, NP       And   LORazepam  (ATIVAN ) injection 2 mg  2 mg Intramuscular TID PRN Ajibola, Ene A, NP       haloperidol  lactate (HALDOL ) injection 10 mg  10 mg Intramuscular TID PRN Ajibola, Ene A, NP       And   diphenhydrAMINE  (BENADRYL ) injection 50 mg  50 mg Intramuscular TID PRN Ajibola, Ene A, NP       And   LORazepam  (ATIVAN ) injection 2 mg  2 mg Intramuscular TID PRN Ajibola, Ene A, NP       hydrOXYzine  (ATARAX ) tablet 25 mg  25 mg Oral TID PRN Ajibola, Ene A, NP   25 mg at 11/30/23 2121   magnesium  hydroxide (MILK OF MAGNESIA) suspension 30 mL  30 mL Oral Daily PRN Ajibola, Ene A, NP       melatonin tablet 3 mg  3 mg Oral QHS Faunce, Alina, DO   3 mg at 12/01/23 2101   nicotine  (NICODERM CQ  -  dosed in mg/24 hours) patch 14 mg  14 mg Transdermal Daily Pashayan, Alexander S, DO   14 mg at 12/02/23 0818   nicotine  polacrilex (NICORETTE) gum 2 mg  2 mg Oral PRN Bobbitt, Shalon E, NP   2 mg at 12/02/23 0820   traZODone  (DESYREL ) tablet 50 mg  50 mg Oral QHS PRN Ajibola, Ene A, NP   50 mg at 12/01/23 2101   PTA Medications: Medications Prior to Admission  Medication Sig Dispense Refill Last Dose/Taking   buprenorphine-naloxone  (SUBOXONE) 8-2 mg SUBL SL tablet Place 0.5 tablets under the tongue 4 (four) times daily as needed (For opioid use disorder).      hydrOXYzine  (ATARAX ) 25 MG tablet Take 25 mg by mouth 3 (three) times daily as needed for anxiety.      naloxone  (NARCAN ) nasal spray 4 mg/0.1 mL Place 1 spray into the nose once as needed (For opioid overdose).      risperiDONE  (RISPERDAL ) 1 MG tablet Take 1 tablet (1 mg total) by mouth at bedtime. For mood control (Patient not taking: Reported on 11/29/2023) 30 tablet 0    tiZANidine (ZANAFLEX)  4 MG tablet Take 4 mg by mouth every 6 (six) hours as needed for muscle spasms.       Patient Stressors: Financial difficulties   Marital or family conflict   Medication change or noncompliance   Substance abuse    Patient Strengths: Ability for insight  Active sense of humor  Average or above average intelligence  Communication skills  General fund of knowledge   Treatment Modalities: Medication Management, Group therapy, Case management,  1 to 1 session with clinician, Psychoeducation, Recreational therapy.   Physician Treatment Plan for Primary Diagnosis: Stimulant-induced psychotic disorder (HCC) Long Term Goal(s): Improvement in symptoms so as ready for discharge   Short Term Goals: Ability to identify changes in lifestyle to reduce recurrence of condition will improve Ability to identify and develop effective coping behaviors will improve Ability to identify triggers associated with substance abuse/mental health issues will  improve  Medication Management: Evaluate patient's response, side effects, and tolerance of medication regimen.  Therapeutic Interventions: 1 to 1 sessions, Unit Group sessions and Medication administration.  Evaluation of Outcomes: Progressing  Physician Treatment Plan for Secondary Diagnosis: Principal Problem:   Stimulant-induced psychotic disorder (HCC) Active Problems:   Methamphetamine abuse (HCC)   Opioid use disorder, severe, in early remission (HCC)  Long Term Goal(s): Improvement in symptoms so as ready for discharge   Short Term Goals: Ability to identify changes in lifestyle to reduce recurrence of condition will improve Ability to identify and develop effective coping behaviors will improve Ability to identify triggers associated with substance abuse/mental health issues will improve     Medication Management: Evaluate patient's response, side effects, and tolerance of medication regimen.  Therapeutic Interventions: 1 to 1 sessions, Unit Group sessions and Medication administration.  Evaluation of Outcomes: Not Progressing   RN Treatment Plan for Primary Diagnosis: Stimulant-induced psychotic disorder (HCC) Long Term Goal(s): Knowledge of disease and therapeutic regimen to maintain health will improve  Short Term Goals: Ability to remain free from injury will improve, Ability to verbalize frustration and anger appropriately will improve, Ability to demonstrate self-control, Ability to participate in decision making will improve, Ability to verbalize feelings will improve, Ability to disclose and discuss suicidal ideas, Ability to identify and develop effective coping behaviors will improve, and Compliance with prescribed medications will improve  Medication Management: RN will administer medications as ordered by provider, will assess and evaluate patient's response and provide education to patient for prescribed medication. RN will report any adverse and/or side effects  to prescribing provider.  Therapeutic Interventions: 1 on 1 counseling sessions, Psychoeducation, Medication administration, Evaluate responses to treatment, Monitor vital signs and CBGs as ordered, Perform/monitor CIWA, COWS, AIMS and Fall Risk screenings as ordered, Perform wound care treatments as ordered.  Evaluation of Outcomes: Not Progressing   LCSW Treatment Plan for Primary Diagnosis: Stimulant-induced psychotic disorder [facility]) Long Term Goal(s): Safe transition to appropriate next level of care at discharge, Engage patient in therapeutic group addressing interpersonal concerns.  Short Term Goals: Engage patient in aftercare planning with referrals and resources, Increase social support, Increase ability to appropriately verbalize feelings, Increase emotional regulation, Facilitate acceptance of mental health diagnosis and concerns, Facilitate patient progression through stages of change regarding substance use diagnoses and concerns, Identify triggers associated with mental health/substance abuse issues, and Increase skills for wellness and recovery  Therapeutic Interventions: Assess for all discharge needs, 1 to 1 time with Social worker, Explore available resources and support systems, Assess for adequacy in community support network, Educate family and significant  other(s) on suicide prevention, Complete Psychosocial Assessment, Interpersonal group therapy.  Evaluation of Outcomes: Not Progressing   Progress in Treatment: Attending groups: Yes. Participating in groups: Yes. Taking medication as prescribed: Yes. Toleration medication: Yes. Family/Significant other contact made: No, will contact:  Lenzy Kerschner (dad) [phone]. Patient understands diagnosis: Yes. Discussing patient identified problems/goals with staff: Yes. Medical problems stabilized or resolved: Yes. Denies suicidal/homicidal ideation: Yes. Issues/concerns per patient self-inventory: No. None  reported.   New problem(s) identified: No, Describe:  None identified.  New Short Term/Long Term Goal(s): Detox, medication management for mood stabilization; elimination of SI thoughts; development of comprehensive mental wellness/sobriety plan    Patient Goals: Intensive outpatient therapy  Discharge Plan or Barriers: Patient recently admitted. CSW will continue to follow and assess for appropriate referrals and possible discharge planning.    Reason for Continuation of Hospitalization: Anxiety Medication stabilization Withdrawal symptoms  Estimated Length of Stay: 1-2 days  Last 3 [place] Suicide Severity Risk Score: Flowsheet Row [facility] Most recent reading at 11/29/2023 11:40 PM ED from 11/29/2023 in [facility] Most recent reading at 11/29/2023  7:58 PM ED from 11/28/2023 in [facility] Most recent reading at 11/28/2023  6:28 PM  C-SSRS RISK CATEGORY No Risk No Risk No Risk    Last PHQ 2/9 Scores:    08/18/2019    4:57 PM 05/05/2018    1:36 PM 06/26/2016    9:15 AM  Depression screen PHQ 2/9  Decreased Interest 0 0   Down, Depressed, Hopeless 0 0 0  PHQ - 2 Score 0 0 0    Scribe for Treatment Team: Oliva DELENA Salmon, DO 12/02/2023 2:47 PM

## 2023-12-02 NOTE — Group Note (Signed)
 Occupational Therapy Group Note  Group Topic:Coping Skills  Group Date: 12/02/2023 Start Time: 1515 End Time: 1600 Facilitators: Dot Dallas MATSU, OT   Group Description: Group encouraged increased engagement and participation through discussion and activity focused on Coping Ahead. Patients were split up into teams and selected a card from a stack of positive coping strategies. Patients were instructed to act out/charade the coping skill for other peers to guess and receive points for their team. Discussion followed with a focus on identifying additional positive coping strategies and patients shared how they were going to cope ahead over the weekend while continuing hospitalization stay.  Therapeutic Goal(s): Identify positive vs negative coping strategies. Identify coping skills to be used during hospitalization vs coping skills outside of hospital/at home Increase participation in therapeutic group environment and promote engagement in treatment   Participation Level: Engaged   Participation Quality: Independent   Behavior: Appropriate   Speech/Thought Process: Relevant   Affect/Mood: Appropriate   Insight: Fair   Judgement: Fair      Modes of Intervention: Education  Patient Response to Interventions:  Attentive   Plan: Continue to engage patient in OT groups 2 - 3x/week.  12/02/2023  [place] MATSU Dot, OT  Belvie Iribe, OT

## 2023-12-02 NOTE — Plan of Care (Signed)
  Problem: Education: Goal: Emotional status will improve Outcome: Progressing   Problem: Activity: Goal: Interest or engagement in activities will improve Outcome: Progressing Goal: Sleeping patterns will improve Outcome: Progressing

## 2023-12-02 NOTE — Group Note (Signed)
 Date:  12/02/2023 Time:  4:59 PM  Group Topic/Focus: Occupational Therapy   Pt did not attend occupational therapy group   Samwise Eckardt R Georg Ang 12/02/2023, 4:59 PM

## 2023-12-03 DIAGNOSIS — F15159 Other stimulant abuse with stimulant-induced psychotic disorder, unspecified: Principal | ICD-10-CM

## 2023-12-03 DIAGNOSIS — F1121 Opioid dependence, in remission: Secondary | ICD-10-CM

## 2023-12-03 MED ORDER — ARIPIPRAZOLE 5 MG PO TABS: 5.0000 mg | ORAL_TABLET | Freq: Every day | ORAL | 0 refills | Status: AC

## 2023-12-03 NOTE — BHH Suicide Risk Assessment (Signed)
 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  There are no identifiable plans, no associated intent, mild dysphoria and related symptoms, good self-control (both objective and subjective assessment), few other risk factors, and identifiable protective factors, including available and accessible social support.   Follow-up Information     [place], [employer]. Go to.   Why: Please continue services with [facility] for addiction treatment and recovery services. Please take your discharge papework in hand to your next dosing appointment. Contact information: [address] [place] [address] [phone]         [facility], [facility]. Go on 12/04/2023.   Specialty: Behavioral Health Why: You have an assessment appointment on 12/04/23 at 2:00 pm  for therapy and medication management services.  The appointment will be held in person. Contact information: [address] [place] [address] [phone]                   Oliva DELENA Salmon, DO 12/03/2023, 9:32 AM

## 2023-12-03 NOTE — Discharge Summary (Signed)
 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)   Past Psychiatric History: ***  Past Medical History:  Past Medical History:  Diagnosis Date   IV drug user     Past Surgical History:  Procedure Laterality Date   APPENDECTOMY     Family History: History reviewed. No pertinent family history. Family Psychiatric  History: *** Social History:  Social History   Substance and Sexual Activity  Alcohol Use Yes   Comment: rarely     Social History   Substance and Sexual Activity  Drug Use Yes   Types: Marijuana, Methamphetamines, Fentanyl , Heroin    Social History   Socioeconomic History   Marital status: Single    Spouse name: Not on file   Number of children: Not on file   Years of education: Not on file   Highest education level: Not on file  Occupational History   Not on file  Tobacco Use   Smoking status: Every Day    Current packs/day: 1.00    Average packs/day: 1 pack/day for 20.8 years (20.8 ttl pk-yrs)    Types: Cigarettes    Start date: 02/06/2003   Smokeless tobacco: Never   Tobacco comments:    cutting back  Substance and Sexual Activity   Alcohol use: Yes    Comment: rarely   Drug use: Yes    Types: Marijuana, Methamphetamines, Fentanyl , Heroin   Sexual activity: Yes    Birth control/protection: Condom  Other Topics Concern   Not on file  Social History Narrative   Not on file   Social Drivers of Health   Financial Resource Strain: Not on file   Food Insecurity: Food Insecurity Present (11/30/2023)   Hunger Vital Sign    Worried About Running Out of Food in the Last Year: Sometimes true    Ran Out of Food in the Last Year: Sometimes true  Transportation Needs: No Transportation Needs (11/30/2023)   PRAPARE - Administrator, Civil Service (Medical): No    Lack of Transportation (Non-Medical): No  Physical Activity: Not on file  Stress: Not on file  Social Connections: Unknown (11/09/2022)   Received from [facility]   Social Network    Social Network: Not on file    Hospital Course:  ***  Physical Findings: AIMS:  , ,  ,  ,  ,  ,   CIWA:    COWS:     Musculoskeletal: Strength & Muscle Tone: {desc; muscle tone:32375} Gait & Station: {PE GAIT ED WJUO:77474} Patient leans: {Patient Leans:21022755}   Psychiatric Specialty Exam:  Presentation  General Appearance:  Appropriate for Environment  Eye Contact: Good  Speech: Clear and Coherent; Normal Rate  Speech Volume: Normal  Handedness: Right   Mood and Affect  Mood: Anxious  Affect: Appropriate; Congruent   Thought Process  Thought Processes: Other (comment) (Circumstantial)  Descriptions of Associations:Loose  Orientation:Full (Time, Place and Person)  Thought Content:WDL  History of Schizophrenia/Schizoaffective disorder:No  Duration of Psychotic Symptoms:N/A  Hallucinations:No data recorded  Ideas of Reference:None  Suicidal Thoughts:No data recorded Homicidal Thoughts:No data recorded  Sensorium  Memory: Immediate Fair  Judgment: Poor  Insight: Shallow; Poor   Executive Functions  Concentration: Fair  Attention Span: Fair  Recall: [employer] of Knowledge: Fair  Language: Fair   Psychomotor Activity  Psychomotor Activity:No data recorded  Assets  Assets: Communication Skills; Financial Resources/Insurance; Housing; Physical Health; Resilience   Sleep  Sleep:No data recorded Estimated  Sleeping Duration (Last 24 Hours): 8.00-8.75 hours   Physical Exam: Physical Exam Vitals reviewed.  Constitutional:      Appearance: Normal appearance. He is normal weight.  HENT:     Head: Normocephalic and atraumatic.  Pulmonary:     Effort: Pulmonary effort is normal.  Musculoskeletal:        General: Normal range of motion.  Skin:    General: Skin is warm and dry.  Neurological:     General: No focal deficit present.     Mental Status: He is alert and oriented to person, place, and time.    ROS Blood pressure 128/63, pulse 71, temperature 97.6 F (36.4 C), temperature source Oral, resp. rate 16, height 5' 8 (1.727 m), weight 67 kg, SpO2 99%. Body mass index is 22.44 kg/m.   Social History   Tobacco Use  Smoking Status Every Day   Current packs/day: 1.00   Average packs/day: 1 pack/day for 20.8 years (20.8 ttl pk-yrs)   Types: Cigarettes   Start date: 02/06/2003  Smokeless Tobacco Never  Tobacco Comments   cutting back   Tobacco Cessation:  A prescription for an FDA-approved tobacco cessation medication was offered at discharge and the patient refused   Blood Alcohol level:  Lab Results  Component Value Date   [facility] <15 11/29/2023   [facility] <15 07/29/2023    Metabolic Disorder Labs:  Lab Results  Component Value Date   HGBA1C 4.9 11/29/2023   MPG 93.93 11/29/2023   No results found for: PROLACTIN Lab Results  Component Value Date   CHOL 147 11/29/2023   TRIG 64 11/29/2023   HDL 50 11/29/2023   CHOLHDL 2.9 11/29/2023   VLDL 13 11/29/2023   LDLCALC 84 11/29/2023    See Psychiatric Specialty Exam and Suicide Risk Assessment completed by Attending Physician prior to discharge.  Discharge destination:  Home  Is patient on multiple antipsychotic therapies at discharge:  No     Allergies as of 12/03/2023   No Known Allergies   Med Rec must be completed prior to using this [employer]***       Follow-up Information     [place], [employer].  Go to.   Why: Please continue services with [facility] for addiction treatment and recovery services. Please take your discharge papework in hand to your next dosing appointment. Contact information: [address] [place] [address] [phone]         [facility], [facility]. Go on 12/04/2023.   Specialty: Behavioral Health Why: You have an assessment appointment on 12/04/23 at 2:00 pm  for therapy and medication management services.  The appointment will be held in person. Contact information: [address] [place] [address] [phone]                 Follow-up recommendations:  {BHH DC FU RECOMMENDATIONS:22620}  Comments:  ***  Signed: Oliva DELENA Salmon, DO 12/03/2023, 9:35 AM

## 2023-12-03 NOTE — Progress Notes (Signed)
 Patient discharged to home accompanied by friend. Discharge instructions, all required discharge documents and information about follow-up appointment given to pt with verbalization of understanding. All personal belongings returned to pt at time of discharge. Pt escorted to lobby by RN at 1025.  12/03/23 0834  Psych Admission Type (Psych Patients Only)  Admission Status Involuntary  Psychosocial Assessment  Patient Complaints None  Eye Contact Fair  Facial Expression Animated  Affect Appropriate to circumstance  Speech Logical/coherent  Interaction Assertive  Motor Activity Other (Comment) (WNL)  Appearance/Hygiene Improved  Behavior Characteristics Appropriate to situation  Mood Anxious  Thought Process  Coherency WDL  Content WDL  Delusions None reported or observed  Perception WDL  Hallucination None reported or observed  Judgment WDL  Confusion None  Danger to Self  Current suicidal ideation? Denies  Agreement Not to Harm Self Yes  Description of Agreement Verbal  Danger to Others  Danger to Others None reported or observed

## 2023-12-03 NOTE — Progress Notes (Signed)
  [facility] Adult Case Management Discharge Plan :  Will you be returning to the same living situation after discharge:  Yes,  patient will be returning to address on file. At discharge, do you have transportation home?: Yes,  patient's significant other, Rosaline Mackintosh [phone], will be providing transportation at 10am.  Do you have the ability to pay for your medications: Yes,  patient has active health insurance.  Release of information consent forms completed and in the chart;  Patient's signature needed at discharge.  Patient to Follow up at:  Follow-up Information     [place], [employer]. Go to.   Why: Please continue services with [facility] for addiction treatment and recovery services. Please take your discharge papework in hand to your next dosing appointment. Contact information: [address] [place] [address] [phone]         [facility], [facility]. Go on 12/04/2023.   Specialty: Behavioral Health Why: You have an assessment appointment on 12/04/23 at 2:00 pm  for therapy and medication management services.  The appointment will be held in person. Contact information: [address] [place] [address] [phone]                 Next level of care provider has access to [facility] Link:no  Safety Planning and Suicide Prevention discussed: Yes,  completed with patient. Suicide Prevention Education was reviewed thoroughly with patient, including risk factors, warning signs, and what to do. Mobile Crisis services were described and that telephone number pointed out, with encouragement to patient to put this number in personal cell phone. Brochure was provided to patient to share with natural supports. Patient acknowledged the ways in which they are at risk, and how working through each of their issues can gradually start to reduce their risk factors. Patient was encouraged to think of the information in the context of people in  their own lives. Patient denied having access to firearms Patient verbalized understanding of information provided. Patient endorsed a desire to live.      Has patient been referred to the Quitline?: Patient refused referral for treatment  Patient has been referred for addiction treatment: Yes, the patient will follow up with an outpatient provider for substance use disorder. Psychiatrist/APP: appointment made. See above.  Louetta Lame, LCSWA 12/03/2023, 9:45 AM

## 2023-12-03 NOTE — Group Note (Signed)
 Date:  12/03/2023 Time:  9:39 AM  Group Topic/Focus:  Goals Group:   The focus of this group is to help patients establish daily goals to achieve during treatment and discuss how the patient can incorporate goal setting into their daily lives to aide in recovery. Orientation:   The focus of this group is to educate the patient on the purpose and policies of crisis stabilization and provide a format to answer questions about their admission.  The group details unit policies and expectations of patients while admitted.    Participation Level:  Active  Participation Quality:  Appropriate  Affect:  Appropriate  Cognitive:  Appropriate  Insight: Appropriate  Engagement in Group:  Engaged  Modes of Intervention:  Discussion and Orientation  Additional Comments:    Jermaine Hood 12/03/2023, 9:39 AM

## 2023-12-03 NOTE — Plan of Care (Signed)
   Problem: Activity: Goal: Interest or engagement in activities will improve Outcome: Progressing Goal: Sleeping patterns will improve Outcome: Progressing   Problem: Coping: Goal: Ability to verbalize frustrations and anger appropriately will improve Outcome: Progressing Goal: Ability to demonstrate self-control will improve Outcome: Progressing   Problem: Health Behavior/Discharge Planning: Goal: Identification of resources available to assist in meeting health care needs will improve Outcome: Progressing Goal: Compliance with treatment plan for underlying cause of condition will improve Outcome: Progressing   Problem: Safety: Goal: Periods of time without injury will increase Outcome: Progressing

## 2023-12-03 NOTE — Group Note (Signed)
 Date:  12/03/2023 Time:  11:05 AM  Group Topic/Focus:  Pet Therapy:   This group aims to reduce stress and anxiety by using trained animals as a therapeutic tool.    Participation Level:  Pt did attend group   Jermaine Hood D [name] 12/03/2023, 11:05 AM

## 2023-12-03 NOTE — [facility] Suicide Risk Assessment (Signed)
 [facility] INPATIENT:  Family/Significant Other Suicide Prevention Education  Suicide Prevention Education:  Education Completed; Jermaine Hood (father) [phone] ,  (name of family member/significant other) has been identified by the patient as the family member/significant other with whom the patient will be residing, and identified as the person(s) who will aid the patient in the event of a mental health crisis (suicidal ideations/suicide attempt).  With written consent from the patient, the family member/significant other has been provided the following suicide prevention education, prior to the and/or following the discharge of the patient.  The suicide prevention education provided includes the following: Suicide risk factors Suicide prevention and interventions National Suicide Hotline telephone number [facility] assessment telephone number [facility] Emergency Assistance [facility] and/or Residential Mobile Crisis Unit telephone number  Request made of family/significant other to: Remove weapons (e.g., guns, rifles, knives), all items previously/currently identified as safety concern.   Remove drugs/medications (over-the-counter, prescriptions, illicit drugs), all items previously/currently identified as a safety concern.  Jermaine Hood states there are no weapons in his home or his son's. Jermaine Hood agrees to be a support for [employer] and knows who to contact in the event of a mental health crisis.   The family member/significant other verbalizes understanding of the suicide prevention education information provided.  The family member/significant other agrees to remove the items of safety concern listed above.  Jermaine Hood 12/03/2023, 10:00 AM

## 2023-12-04 DIAGNOSIS — F1220 Cannabis dependence, uncomplicated: Secondary | ICD-10-CM | POA: Diagnosis not present

## 2023-12-04 DIAGNOSIS — F1520 Other stimulant dependence, uncomplicated: Secondary | ICD-10-CM | POA: Diagnosis not present

## 2023-12-04 DIAGNOSIS — F1020 Alcohol dependence, uncomplicated: Secondary | ICD-10-CM | POA: Diagnosis not present

## 2023-12-09 ENCOUNTER — Telehealth (HOSPITAL_COMMUNITY): Payer: Self-pay | Admitting: Licensed Clinical Social Worker

## 2023-12-09 ENCOUNTER — Ambulatory Visit (HOSPITAL_COMMUNITY): Payer: Self-pay

## 2023-12-09 ENCOUNTER — Encounter (HOSPITAL_COMMUNITY): Payer: Self-pay | Admitting: Licensed Clinical Social Worker

## 2023-12-09 NOTE — Telephone Encounter (Signed)
 The therapist attempts to reach [facility] when he does not show for his assessment today regarding attending [facility]. The therapist is unable to leave a HIPAA-compliant voicemail as he has a voicemail that has not been set-up yet so the therapist sends no show correspondence to him via MyChart.  Zell Maier, MA, LCSW, [facility], LCAS 12/09/2023

## 2023-12-10 DIAGNOSIS — F1220 Cannabis dependence, uncomplicated: Secondary | ICD-10-CM | POA: Diagnosis not present

## 2023-12-10 DIAGNOSIS — F1020 Alcohol dependence, uncomplicated: Secondary | ICD-10-CM | POA: Diagnosis not present

## 2023-12-10 DIAGNOSIS — F1520 Other stimulant dependence, uncomplicated: Secondary | ICD-10-CM | POA: Diagnosis not present

## 2023-12-11 DIAGNOSIS — F1020 Alcohol dependence, uncomplicated: Secondary | ICD-10-CM | POA: Diagnosis not present

## 2023-12-11 DIAGNOSIS — F1520 Other stimulant dependence, uncomplicated: Secondary | ICD-10-CM | POA: Diagnosis not present

## 2023-12-11 DIAGNOSIS — F1220 Cannabis dependence, uncomplicated: Secondary | ICD-10-CM | POA: Diagnosis not present

## 2023-12-13 ENCOUNTER — Ambulatory Visit: Payer: Self-pay

## 2023-12-13 DIAGNOSIS — F1020 Alcohol dependence, uncomplicated: Secondary | ICD-10-CM | POA: Diagnosis not present

## 2023-12-13 DIAGNOSIS — F1520 Other stimulant dependence, uncomplicated: Secondary | ICD-10-CM | POA: Diagnosis not present

## 2023-12-13 DIAGNOSIS — F1220 Cannabis dependence, uncomplicated: Secondary | ICD-10-CM | POA: Diagnosis not present

## 2023-12-13 NOTE — Telephone Encounter (Signed)
 FYI Only or Action Required?: FYI only for provider: NP appt scheduled.  Patient was last seen in primary care on not a [facility] pt.  Called Nurse Triage reporting Leg Swelling Swelling comes and goes.  Symptoms began over 1 year.  Interventions attempted: Other: elevating feet.  Symptoms are: unchanged.  Triage Disposition: See PCP Within 2 Weeks  Patient/caregiver understands and will follow disposition?: Yes                     Copied from CRM [id number]. Topic: Clinical - Red Word Triage >> Dec 13, 2023  8:54 AM Alfonso ORN wrote: Red Word that prompted transfer to Nurse Triage:  fluid build up in ankles and is real bad at night  Patient had lab report regarding is liver  (patient need to be establish care with a provider request to be establish at the [facility] , use to go there however have not been seen since 2021    ----------------------------------------------------------------------- From previous Reason for Contact - Scheduling: Patient/patient representative is calling to schedule an appointment. Refer to attachments for appointment information. >> Dec 13, 2023  8:59 AM Alfonso ORN wrote: Patient stated the lab report come back that he has fibrosis in the liver  Answer Assessment - Initial Assessment Questions 1. ONSET: When did the swelling start? (e.g., minutes, hours, days)     1 year 2. LOCATION: What part of the leg is swollen?  Are both legs swollen or just one leg?     both 3. SEVERITY: How bad is the swelling? (e.g., localized; mild, moderate, severe)     Above ankles - moderate 4. REDNESS: Is there redness or signs of infection?     warmth 5. PAIN: Is the swelling painful to touch? If Yes, ask: How painful is it?   (Scale 1-10; mild, moderate or severe)     Up to 6-7/10 6. FEVER: Do you have a fever? If Yes, ask: What is it, how was it measured, and when did it start?      no 7. CAUSE: What do you think is causing the  leg swelling?     Liver fibrosis 8. MEDICAL HISTORY: Do you have a history of blood clots (e.g., DVT), cancer, heart failure, kidney disease, or liver failure?     liver 9. RECURRENT SYMPTOM: Have you had leg swelling before? If Yes, ask: When was the last time? What happened that time?     1 year 10. OTHER SYMPTOMS: Do you have any other symptoms? (e.g., chest pain, difficulty breathing)       Darkening of urine - liver fibrosis  Protocols used: Leg Swelling and Edema-A-AH  Reason for Disposition  [1] MILD swelling of both ankles (i.e., pedal edema) AND [2] is a chronic symptom (recurrent or ongoing AND present > 4 weeks)  Answer Assessment - Initial Assessment Questions 1. ONSET: When did the swelling start? (e.g., minutes, hours, days)     1 year 2. LOCATION: What part of the leg is swollen?  Are both legs swollen or just one leg?     both 3. SEVERITY: How bad is the swelling? (e.g., localized; mild, moderate, severe)     Above ankles - moderate 4. REDNESS: Is there redness or signs of infection?     warmth 5. PAIN: Is the swelling painful to touch? If Yes, ask: How painful is it?   (Scale 1-10; mild, moderate or severe)     Up to 6-7/10 6. FEVER:  Do you have a fever? If Yes, ask: What is it, how was it measured, and when did it start?      no 7. CAUSE: What do you think is causing the leg swelling?     Liver fibrosis 8. MEDICAL HISTORY: Do you have a history of blood clots (e.g., DVT), cancer, heart failure, kidney disease, or liver failure?     liver 9. RECURRENT SYMPTOM: Have you had leg swelling before? If Yes, ask: When was the last time? What happened that time?     1 year 10. OTHER SYMPTOMS: Do you have any other symptoms? (e.g., chest pain, difficulty breathing)       Darkening of urine - liver fibrosis  Protocols used: Leg Swelling and Edema-A-AH

## 2023-12-16 DIAGNOSIS — F1520 Other stimulant dependence, uncomplicated: Secondary | ICD-10-CM | POA: Diagnosis not present

## 2023-12-16 DIAGNOSIS — F1220 Cannabis dependence, uncomplicated: Secondary | ICD-10-CM | POA: Diagnosis not present

## 2023-12-16 DIAGNOSIS — F1020 Alcohol dependence, uncomplicated: Secondary | ICD-10-CM | POA: Diagnosis not present

## 2023-12-17 DIAGNOSIS — F1220 Cannabis dependence, uncomplicated: Secondary | ICD-10-CM | POA: Diagnosis not present

## 2023-12-17 DIAGNOSIS — F1020 Alcohol dependence, uncomplicated: Secondary | ICD-10-CM | POA: Diagnosis not present

## 2023-12-17 DIAGNOSIS — F1520 Other stimulant dependence, uncomplicated: Secondary | ICD-10-CM | POA: Diagnosis not present

## 2023-12-18 DIAGNOSIS — F1220 Cannabis dependence, uncomplicated: Secondary | ICD-10-CM | POA: Diagnosis not present

## 2023-12-18 DIAGNOSIS — F1520 Other stimulant dependence, uncomplicated: Secondary | ICD-10-CM | POA: Diagnosis not present

## 2023-12-18 DIAGNOSIS — F1020 Alcohol dependence, uncomplicated: Secondary | ICD-10-CM | POA: Diagnosis not present

## 2023-12-20 DIAGNOSIS — F1520 Other stimulant dependence, uncomplicated: Secondary | ICD-10-CM | POA: Diagnosis not present

## 2023-12-20 DIAGNOSIS — F1220 Cannabis dependence, uncomplicated: Secondary | ICD-10-CM | POA: Diagnosis not present

## 2023-12-20 DIAGNOSIS — F1020 Alcohol dependence, uncomplicated: Secondary | ICD-10-CM | POA: Diagnosis not present

## 2023-12-23 ENCOUNTER — Telehealth: Payer: Self-pay

## 2023-12-23 DIAGNOSIS — F1020 Alcohol dependence, uncomplicated: Secondary | ICD-10-CM | POA: Diagnosis not present

## 2023-12-23 DIAGNOSIS — F1520 Other stimulant dependence, uncomplicated: Secondary | ICD-10-CM | POA: Diagnosis not present

## 2023-12-23 DIAGNOSIS — F1220 Cannabis dependence, uncomplicated: Secondary | ICD-10-CM | POA: Diagnosis not present

## 2023-12-23 NOTE — Telephone Encounter (Signed)
 Received test from Dr. Loa office. Dr. Eben is not a PCP. I have cancelled this NP appt with Dr. Eben and called pt to re-schedule NP with another provider. No answer - left message on pt's machine to call back and schedule NP appt with another provider.

## 2023-12-24 DIAGNOSIS — F1520 Other stimulant dependence, uncomplicated: Secondary | ICD-10-CM | POA: Diagnosis not present

## 2023-12-24 DIAGNOSIS — F1220 Cannabis dependence, uncomplicated: Secondary | ICD-10-CM | POA: Diagnosis not present

## 2023-12-24 DIAGNOSIS — F1020 Alcohol dependence, uncomplicated: Secondary | ICD-10-CM | POA: Diagnosis not present

## 2023-12-25 ENCOUNTER — Ambulatory Visit: Payer: Self-pay | Admitting: Infectious Diseases

## 2023-12-25 DIAGNOSIS — F1220 Cannabis dependence, uncomplicated: Secondary | ICD-10-CM | POA: Diagnosis not present

## 2023-12-25 DIAGNOSIS — F1020 Alcohol dependence, uncomplicated: Secondary | ICD-10-CM | POA: Diagnosis not present

## 2023-12-25 DIAGNOSIS — F1520 Other stimulant dependence, uncomplicated: Secondary | ICD-10-CM | POA: Diagnosis not present

## 2023-12-27 DIAGNOSIS — F1520 Other stimulant dependence, uncomplicated: Secondary | ICD-10-CM | POA: Diagnosis not present

## 2023-12-27 DIAGNOSIS — F1220 Cannabis dependence, uncomplicated: Secondary | ICD-10-CM | POA: Diagnosis not present

## 2023-12-27 DIAGNOSIS — F1020 Alcohol dependence, uncomplicated: Secondary | ICD-10-CM | POA: Diagnosis not present

## 2023-12-30 DIAGNOSIS — F1220 Cannabis dependence, uncomplicated: Secondary | ICD-10-CM | POA: Diagnosis not present

## 2023-12-30 DIAGNOSIS — F1520 Other stimulant dependence, uncomplicated: Secondary | ICD-10-CM | POA: Diagnosis not present

## 2023-12-30 DIAGNOSIS — F1020 Alcohol dependence, uncomplicated: Secondary | ICD-10-CM | POA: Diagnosis not present

## 2023-12-31 DIAGNOSIS — F1220 Cannabis dependence, uncomplicated: Secondary | ICD-10-CM | POA: Diagnosis not present

## 2023-12-31 DIAGNOSIS — F1020 Alcohol dependence, uncomplicated: Secondary | ICD-10-CM | POA: Diagnosis not present

## 2023-12-31 DIAGNOSIS — F1520 Other stimulant dependence, uncomplicated: Secondary | ICD-10-CM | POA: Diagnosis not present

## 2024-01-01 DIAGNOSIS — F1220 Cannabis dependence, uncomplicated: Secondary | ICD-10-CM | POA: Diagnosis not present

## 2024-01-01 DIAGNOSIS — F1020 Alcohol dependence, uncomplicated: Secondary | ICD-10-CM | POA: Diagnosis not present

## 2024-01-01 DIAGNOSIS — F1520 Other stimulant dependence, uncomplicated: Secondary | ICD-10-CM | POA: Diagnosis not present

## 2024-02-12 ENCOUNTER — Ambulatory Visit: Admitting: Family Medicine

## 2024-02-12 NOTE — Progress Notes (Incomplete)
 " [facility] [address] [place] [address] Dept: [phone] Dept Fax: [phone]  New Patient Office Visit  Subjective:    Patient ID: Jermaine Hood Jermaine Hood, male    DOB: Dec 17, 1985, 39 y.o..   MRN: [id number]  No chief complaint on file.  History of Present Illness:  Patient is in today to establish care.  Mr. Jermaine Hood has a history of depression. This is managed with aripiprazole  5 mg daily and hydroxyzine  23 mg tid as needed.  Mr. Jermaine Hood has a history of substance dependence, specifically methamphetamine. From October 9th 2025 to October 24th 2025, Mr. Jermaine Hood was seen in the emergency department three times for depression and substance abuse.  Past Medical History: Patient Active Problem List   Diagnosis Date Noted   Methamphetamine abuse (HCC) 11/30/2023   Opioid use disorder, severe, in early remission (HCC) 11/30/2023   MDD (major depressive disorder) 07/30/2023   History of herpes simplex infection 10/16/2016   Depression 10/16/2016   Medication refill 10/16/2016   Liver fibrosis 02/08/2015   Chronic hepatitis C without hepatic coma (HCC) 03/02/2014   Substance dependence, in remission (HCC) 03/26/2013   Past Surgical History:  Procedure Laterality Date   APPENDECTOMY     No family history on file. Outpatient Medications Prior to Visit  Medication Sig Dispense Refill   ARIPiprazole  (ABILIFY ) 5 MG tablet Take 1 tablet (5 mg total) by mouth daily. 30 tablet 0   buprenorphine-naloxone  (SUBOXONE) 8-2 mg SUBL SL tablet Place 0.5 tablets under the tongue 4 (four) times daily as needed (For opioid use disorder).     hydrOXYzine  (ATARAX ) 25 MG tablet Take 25 mg by mouth 3 (three) times daily as needed for anxiety.     naloxone  (NARCAN ) nasal spray 4 mg/0.1 mL Place 1 spray into the nose once as needed (For opioid overdose).     tiZANidine (ZANAFLEX) 4 MG tablet Take 4 mg by mouth every 6 (six) hours as needed for muscle  spasms.     No facility-administered medications prior to visit.   Allergies[1] Objective:   There were no vitals filed for this visit. There is no height or weight on file to calculate BMI.   General: Well developed, well nourished. No acute distress. HEENT: Normocephalic, non-traumatic. PERRL, EOMI. Conjunctiva clear. External ears normal. EAC and TMs normal bilaterally. Nose    clear without congestion or rhinorrhea. Mucous membranes moist. Oropharynx clear. Good dentition. Neck: Supple. No lymphadenopathy. No thyromegaly. Lungs: Clear to auscultation bilaterally. No wheezing, rales or rhonchi. CV: RRR without murmurs or rubs. Pulses 2+ bilaterally. Abdomen: Soft, non-tender. Bowel sounds positive, normal pitch and frequency. No hepatosplenomegaly. No rebound or guarding. Back: Straight. No CVA tenderness bilaterally. Extremities: Full ROM. No joint swelling or tenderness. No edema noted. Skin: Warm and dry. No rashes. Neuro: CN II-XII intact. Normal sensation and DTR bilaterally. Psych: Alert and oriented. Normal mood and affect.  Health Maintenance Due  Topic Date Due   Pneumococcal Vaccine (1 of 2 - PCV) Never done   Hepatitis B Vaccines 19-59 Average Risk (1 of 3 - 19+ 3-dose series) 09/16/2006   HPV VACCINES (1 - 3-dose SCDM series) Never done   Influenza Vaccine  Never done   COVID-19 Vaccine (1 - 2025-26 season) Never done   Lab Results {Labs (Optional):29002}    Assessment & Plan:   Problem List Items Addressed This Visit   None   No follow-ups on file.   Jermaine CHRISTELLA Simpler, MD  JermaineJermaine Hood,acting as a scribe for Jermaine CHRISTELLA Simpler, MD.,have documented all relevant documentation on the behalf of Jermaine CHRISTELLA Simpler, MD.  I, Jermaine CHRISTELLA Simpler, MD, have reviewed all documentation for this visit. The documentation on 02/12/2024 for the exam, diagnosis, procedures, and orders are all accurate and complete.    [1] No Known Allergies  "

## 2024-02-13 ENCOUNTER — Ambulatory Visit: Payer: Self-pay | Admitting: Family Medicine
